# Patient Record
Sex: Male | Born: 1945 | Race: White | Hispanic: No | Marital: Married | Smoking: Former smoker
Health system: Southern US, Community
[De-identification: ages and names within clinical notes are randomized; demographics above are authoritative.]

## PROBLEM LIST (undated history)

## (undated) DIAGNOSIS — E785 Hyperlipidemia, unspecified: Secondary | ICD-10-CM

## (undated) DIAGNOSIS — I10 Essential (primary) hypertension: Secondary | ICD-10-CM

## (undated) DIAGNOSIS — F419 Anxiety disorder, unspecified: Secondary | ICD-10-CM

## (undated) DIAGNOSIS — E119 Type 2 diabetes mellitus without complications: Secondary | ICD-10-CM

## (undated) DIAGNOSIS — H9190 Unspecified hearing loss, unspecified ear: Secondary | ICD-10-CM

## (undated) DIAGNOSIS — I2510 Atherosclerotic heart disease of native coronary artery without angina pectoris: Secondary | ICD-10-CM

## (undated) DIAGNOSIS — Z9109 Other allergy status, other than to drugs and biological substances: Secondary | ICD-10-CM

## (undated) DIAGNOSIS — I890 Lymphedema, not elsewhere classified: Secondary | ICD-10-CM

## (undated) DIAGNOSIS — M1990 Unspecified osteoarthritis, unspecified site: Secondary | ICD-10-CM

## (undated) DIAGNOSIS — I499 Cardiac arrhythmia, unspecified: Secondary | ICD-10-CM

## (undated) HISTORY — PX: COLONOSCOPY: SHX174

## (undated) HISTORY — PX: FOOT SURGERY: SHX648

## (undated) HISTORY — PX: HERNIA REPAIR: SHX51

---

## 1977-05-07 HISTORY — PX: SCALP LACERATION REPAIR: SHX6089

## 2009-01-07 ENCOUNTER — Inpatient Hospital Stay: Payer: Self-pay | Admitting: Internal Medicine

## 2009-01-13 ENCOUNTER — Encounter: Payer: Self-pay | Admitting: Internal Medicine

## 2009-01-21 ENCOUNTER — Ambulatory Visit: Payer: Self-pay | Admitting: Internal Medicine

## 2009-01-28 ENCOUNTER — Inpatient Hospital Stay: Payer: Self-pay | Admitting: Internal Medicine

## 2010-02-22 IMAGING — CR DG CHEST 2V
1 series · 2 of 2 positions shown · non-contrast
Comparison: none

REASON FOR EXAM: fever
COMMENTS:

[Series 1: view not recorded · 0.17mm/px · 2 of 2 slices shown]
[im 1/2]
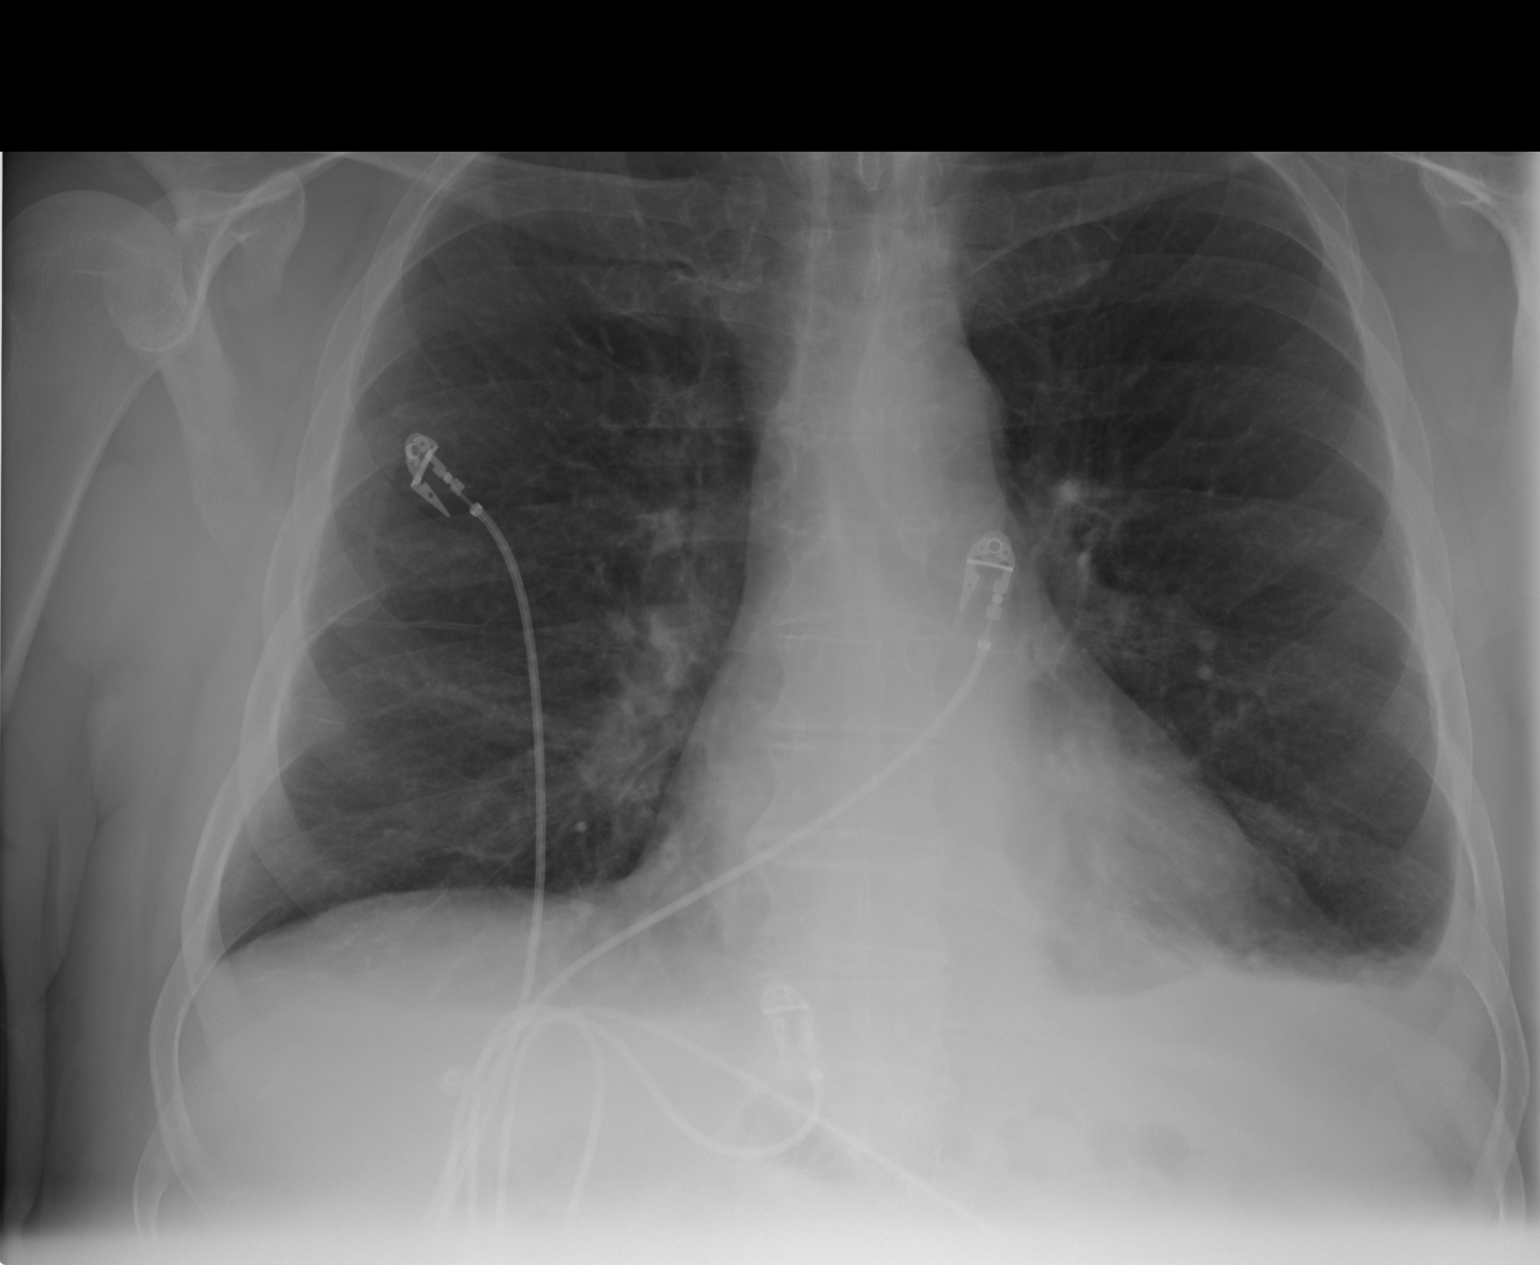
[im 2/2]
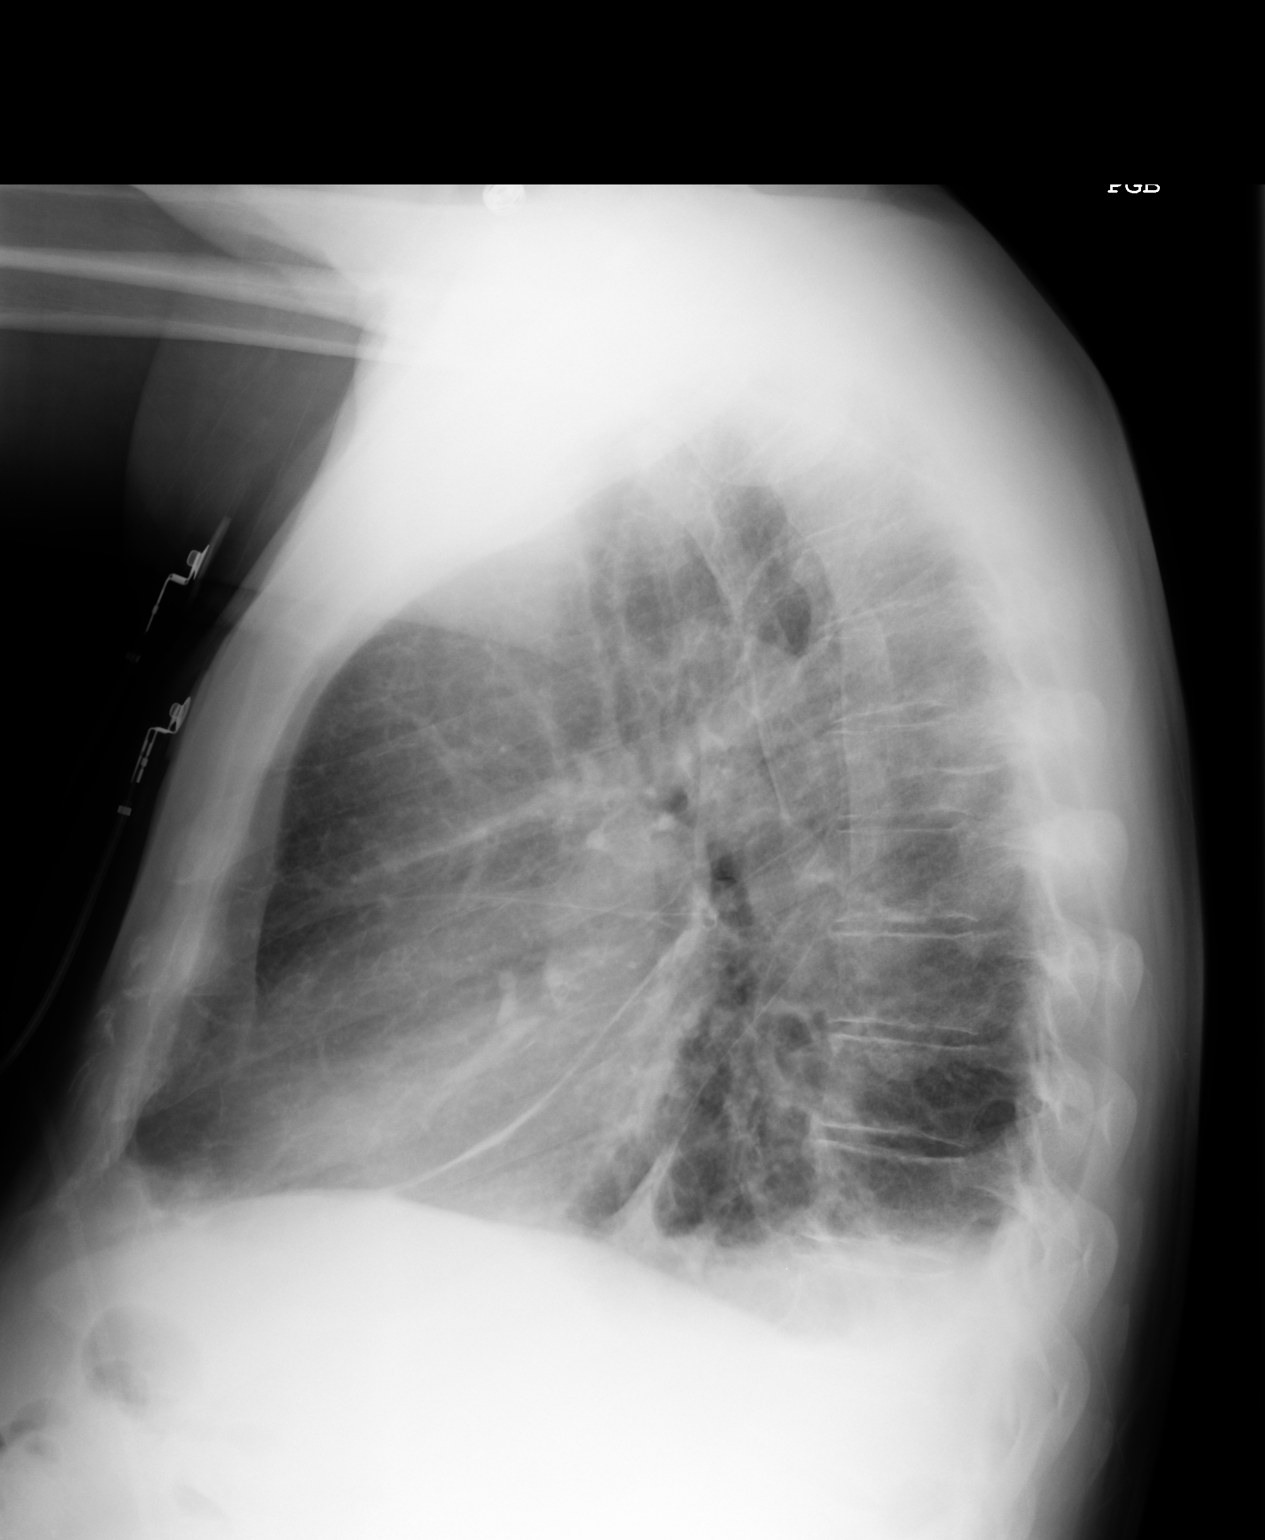

[2 of 2 positions shown; findings below may reference images not displayed]

PROCEDURE:     DXR - DXR CHEST PA (OR AP) AND LATERAL  - February 01, 2009  [DATE]

RESULT:     Comparison is made to the exam of 01/28/2009. There is a small
amount of pleural effusion and atelectasis at the left lung base. The
cardiac silhouette is normal. The right lung is clear. There is no edema or
pneumothorax. Cardiac monitoring electrodes are present.
IMPRESSION: Small left pleural effusion with some left basilar atelectasis.

## 2010-09-28 ENCOUNTER — Ambulatory Visit: Payer: Self-pay | Admitting: Urology

## 2010-10-10 ENCOUNTER — Ambulatory Visit: Payer: Self-pay | Admitting: Urology

## 2010-10-12 LAB — PATHOLOGY REPORT

## 2011-10-11 ENCOUNTER — Ambulatory Visit: Payer: Self-pay | Admitting: Gastroenterology

## 2011-10-15 LAB — PATHOLOGY REPORT

## 2012-02-04 DIAGNOSIS — N430 Encysted hydrocele: Secondary | ICD-10-CM | POA: Insufficient documentation

## 2012-02-04 DIAGNOSIS — N403 Nodular prostate with lower urinary tract symptoms: Secondary | ICD-10-CM | POA: Insufficient documentation

## 2012-02-04 DIAGNOSIS — R339 Retention of urine, unspecified: Secondary | ICD-10-CM | POA: Insufficient documentation

## 2012-02-04 DIAGNOSIS — N138 Other obstructive and reflux uropathy: Secondary | ICD-10-CM | POA: Insufficient documentation

## 2012-02-04 DIAGNOSIS — N411 Chronic prostatitis: Secondary | ICD-10-CM | POA: Insufficient documentation

## 2012-02-04 DIAGNOSIS — R972 Elevated prostate specific antigen [PSA]: Secondary | ICD-10-CM | POA: Insufficient documentation

## 2014-08-09 DIAGNOSIS — E118 Type 2 diabetes mellitus with unspecified complications: Secondary | ICD-10-CM | POA: Insufficient documentation

## 2015-07-26 ENCOUNTER — Encounter: Payer: Self-pay | Admitting: *Deleted

## 2015-08-04 ENCOUNTER — Encounter: Payer: Self-pay | Admitting: *Deleted

## 2015-08-04 ENCOUNTER — Ambulatory Visit
Admission: RE | Admit: 2015-08-04 | Discharge: 2015-08-04 | Disposition: A | Discharge status: Home / Self Care | Payer: Medicare Other | Source: Ambulatory Visit | Attending: Ophthalmology | Admitting: Ophthalmology

## 2015-08-04 ENCOUNTER — Ambulatory Visit: Payer: Medicare Other | Admitting: Certified Registered Nurse Anesthetist

## 2015-08-04 ENCOUNTER — Encounter
Admission: RE | Disposition: A | Discharge status: Home / Self Care | Payer: Self-pay | Source: Ambulatory Visit | Attending: Ophthalmology

## 2015-08-04 DIAGNOSIS — Z9889 Other specified postprocedural states: Secondary | ICD-10-CM | POA: Insufficient documentation

## 2015-08-04 DIAGNOSIS — H9190 Unspecified hearing loss, unspecified ear: Secondary | ICD-10-CM | POA: Diagnosis not present

## 2015-08-04 DIAGNOSIS — Z79899 Other long term (current) drug therapy: Secondary | ICD-10-CM | POA: Diagnosis not present

## 2015-08-04 DIAGNOSIS — M1990 Unspecified osteoarthritis, unspecified site: Secondary | ICD-10-CM | POA: Insufficient documentation

## 2015-08-04 DIAGNOSIS — H2511 Age-related nuclear cataract, right eye: Secondary | ICD-10-CM | POA: Insufficient documentation

## 2015-08-04 DIAGNOSIS — Z9109 Other allergy status, other than to drugs and biological substances: Secondary | ICD-10-CM | POA: Diagnosis not present

## 2015-08-04 DIAGNOSIS — I10 Essential (primary) hypertension: Secondary | ICD-10-CM | POA: Diagnosis not present

## 2015-08-04 DIAGNOSIS — H269 Unspecified cataract: Secondary | ICD-10-CM | POA: Diagnosis present

## 2015-08-04 HISTORY — PX: CATARACT EXTRACTION W/PHACO: SHX586

## 2015-08-04 HISTORY — DX: Unspecified osteoarthritis, unspecified site: M19.90

## 2015-08-04 HISTORY — DX: Unspecified hearing loss, unspecified ear: H91.90

## 2015-08-04 HISTORY — DX: Essential (primary) hypertension: I10

## 2015-08-04 HISTORY — DX: Other allergy status, other than to drugs and biological substances: Z91.09

## 2015-08-04 SURGERY — PHACOEMULSIFICATION, CATARACT, WITH IOL INSERTION
Anesthesia: Monitor Anesthesia Care | Site: Eye | Laterality: Right | Wound class: Clean

## 2015-08-04 MED ORDER — MOXIFLOXACIN HCL 0.5 % OP SOLN
OPHTHALMIC | Status: DC | PRN
  Administered 2015-08-04: 1 [drp] via OPHTHALMIC

## 2015-08-04 MED ORDER — ARMC OPHTHALMIC DILATING GEL
OPHTHALMIC | Status: AC
  Administered 2015-08-04: 1 via OPHTHALMIC
  Filled 2015-08-04: qty 0.25

## 2015-08-04 MED ORDER — SODIUM CHLORIDE 0.9 % IV SOLN
INTRAVENOUS | Status: DC
  Administered 2015-08-04 (×2): via INTRAVENOUS

## 2015-08-04 MED ORDER — ARMC OPHTHALMIC DILATING GEL
1.0000 "application " | OPHTHALMIC | Status: DC | PRN
  Administered 2015-08-04: 1 via OPHTHALMIC

## 2015-08-04 MED ORDER — MOXIFLOXACIN HCL 0.5 % OP SOLN: 1.0000 [drp] | Freq: Once | OPHTHALMIC | Status: DC

## 2015-08-04 MED ORDER — CEFUROXIME OPHTHALMIC INJECTION 1 MG/0.1 ML
INJECTION | OPHTHALMIC | Status: AC
  Filled 2015-08-04: qty 0.1

## 2015-08-04 MED ORDER — POVIDONE-IODINE 5 % OP SOLN
1.0000 "application " | Freq: Once | OPHTHALMIC | Status: AC
  Administered 2015-08-04: 1 via OPHTHALMIC

## 2015-08-04 MED ORDER — TETRACAINE HCL 0.5 % OP SOLN
1.0000 [drp] | Freq: Once | OPHTHALMIC | Status: AC
  Administered 2015-08-04: 1 [drp] via OPHTHALMIC

## 2015-08-04 MED ORDER — ONDANSETRON HCL 4 MG/2ML IJ SOLN: 4.0000 mg | Freq: Once | INTRAMUSCULAR | Status: DC | PRN

## 2015-08-04 MED ORDER — LIDOCAINE HCL (PF) 4 % IJ SOLN
INTRAMUSCULAR | Status: AC
  Filled 2015-08-04: qty 5

## 2015-08-04 MED ORDER — MOXIFLOXACIN HCL 0.5 % OP SOLN
OPHTHALMIC | Status: AC
  Filled 2015-08-04: qty 3

## 2015-08-04 MED ORDER — NA CHONDROIT SULF-NA HYALURON 40-17 MG/ML IO SOLN
INTRAOCULAR | Status: AC
  Filled 2015-08-04: qty 1

## 2015-08-04 MED ORDER — EPINEPHRINE HCL 1 MG/ML IJ SOLN
INTRAMUSCULAR | Status: AC
  Filled 2015-08-04: qty 2

## 2015-08-04 MED ORDER — FENTANYL CITRATE (PF) 100 MCG/2ML IJ SOLN
25.0000 ug | INTRAMUSCULAR | Status: DC | PRN
  Administered 2015-08-04 (×2): 50 ug via INTRAVENOUS

## 2015-08-04 MED ORDER — POVIDONE-IODINE 5 % OP SOLN
OPHTHALMIC | Status: AC
  Administered 2015-08-04: 1 via OPHTHALMIC
  Filled 2015-08-04: qty 30

## 2015-08-04 MED ORDER — LIDOCAINE HCL (PF) 4 % IJ SOLN
INTRAOCULAR | Status: DC | PRN
  Administered 2015-08-04: .5 mL via OPHTHALMIC

## 2015-08-04 MED ORDER — CEFUROXIME OPHTHALMIC INJECTION 1 MG/0.1 ML
INJECTION | OPHTHALMIC | Status: DC | PRN
  Administered 2015-08-04: .1 mL via INTRACAMERAL

## 2015-08-04 MED ORDER — NA CHONDROIT SULF-NA HYALURON 40-17 MG/ML IO SOLN
INTRAOCULAR | Status: DC | PRN
  Administered 2015-08-04: 1 mL via INTRAOCULAR

## 2015-08-04 MED ORDER — TETRACAINE HCL 0.5 % OP SOLN
OPHTHALMIC | Status: AC
  Administered 2015-08-04: 1 [drp] via OPHTHALMIC
  Filled 2015-08-04: qty 2

## 2015-08-04 MED ORDER — MIDAZOLAM HCL 2 MG/2ML IJ SOLN
INTRAMUSCULAR | Status: DC | PRN
  Administered 2015-08-04: 1 mg via INTRAVENOUS

## 2015-08-04 MED ORDER — CARBACHOL 0.01 % IO SOLN
INTRAOCULAR | Status: DC | PRN
  Administered 2015-08-04: .5 mL via INTRAOCULAR

## 2015-08-04 MED ORDER — EPINEPHRINE HCL 1 MG/ML IJ SOLN
INTRAOCULAR | Status: DC | PRN
  Administered 2015-08-04: 1 mL via OPHTHALMIC

## 2015-08-04 SURGICAL SUPPLY — 22 items
CANNULA ANT/CHMB 27GA (MISCELLANEOUS) ×2 IMPLANT
CUP MEDICINE 2OZ PLAST GRAD ST (MISCELLANEOUS) ×2 IMPLANT
GLOVE BIO SURGEON STRL SZ8 (GLOVE) ×2 IMPLANT
GLOVE BIOGEL M 6.5 STRL (GLOVE) ×2 IMPLANT
GLOVE SURG LX 8.0 MICRO (GLOVE) ×1
GLOVE SURG LX STRL 8.0 MICRO (GLOVE) ×1 IMPLANT
GOWN STRL REUS W/ TWL LRG LVL3 (GOWN DISPOSABLE) ×2 IMPLANT
GOWN STRL REUS W/TWL LRG LVL3 (GOWN DISPOSABLE) ×2
LENS IOL TECNIS 19.0 (Intraocular Lens) ×2 IMPLANT
LENS IOL TECNIS MONO 1P 19.0 (Intraocular Lens) ×1 IMPLANT
PACK CATARACT (MISCELLANEOUS) ×2 IMPLANT
PACK CATARACT BRASINGTON LX (MISCELLANEOUS) ×2 IMPLANT
PACK EYE AFTER SURG (MISCELLANEOUS) ×2 IMPLANT
SOL BSS BAG (MISCELLANEOUS) ×2
SOL PREP PVP 2OZ (MISCELLANEOUS) ×2
SOLUTION BSS BAG (MISCELLANEOUS) ×1 IMPLANT
SOLUTION PREP PVP 2OZ (MISCELLANEOUS) ×1 IMPLANT
SYR 3ML LL SCALE MARK (SYRINGE) ×2 IMPLANT
SYR 5ML LL (SYRINGE) ×2 IMPLANT
SYR TB 1ML 27GX1/2 LL (SYRINGE) ×2 IMPLANT
WATER STERILE IRR 1000ML POUR (IV SOLUTION) ×2 IMPLANT
WIPE NON LINTING 3.25X3.25 (MISCELLANEOUS) ×2 IMPLANT

## 2015-08-04 NOTE — H&P (Signed)
All labs reviewed. Abnormal studies sent to patients PCP when indicated.  Previous H&P reviewed, patient examined, there are NO CHANGES.  Jim Guerra LOUIS3/30/201711:39 AM

## 2015-08-04 NOTE — Discharge Instructions (Signed)
AMBULATORY SURGERY  DISCHARGE INSTRUCTIONS   1) The drugs that you were given will stay in your system until tomorrow so for the next 24 hours you should not:  A) Drive an automobile B) Make any legal decisions C) Drink any alcoholic beverage   2) You may resume regular meals tomorrow.  Today it is better to start with liquids and gradually work up to solid foods.  You may eat anything you prefer, but it is better to start with liquids, then soup and crackers, and gradually work up to solid foods.   3) Please notify your doctor immediately if you have any unusual bleeding, trouble breathing, redness and pain at the surgery site, drainage, fever, or pain not relieved by medication.    4) Additional Instructions:   Eye Surgery Discharge Instructions  Expect mild scratchy sensation or mild soreness. DO NOT RUB YOUR EYE!  The day of surgery:  Minimal physical activity, but bed rest is not required  No reading, computer work, or close hand work  No bending, lifting, or straining.  May watch TV  For 24 hours:  No driving, legal decisions, or alcoholic beverages  Safety precautions  Eat anything you prefer: It is better to start with liquids, then soup then solid foods.  _____ Eye patch should be worn until postoperative exam tomorrow.  ____ Solar shield eyeglasses should be worn for comfort in the sunlight/patch while sleeping  Resume all regular medications including aspirin or Coumadin if these were discontinued prior to surgery. You may shower, bathe, shave, or wash your hair. Tylenol may be taken for mild discomfort.  Call your doctor if you experience significant pain, nausea, or vomiting, fever > 101 or other signs of infection. [phone] or [phone] Specific instructions:  Follow-up Information    Follow up with PORFILIO,WILLIAM LOUIS, MD In 1 day.   Specialty:  Ophthalmology   Why:  March 31 at 10:45am   Contact information:   [address] [place] [address] [phone]          Please contact your physician with any problems or Same Day Surgery at [phone], Monday through Friday 6 am to 4 pm, or [facility] at [facility] Main number at [phone].

## 2015-08-04 NOTE — Transfer of Care (Signed)
Immediate Anesthesia Transfer of Care Note  Patient: Jim FiguresMorris E Zerkle Jr.  Procedure(s) Performed: Procedure(s) with comments: CATARACT EXTRACTION PHACO AND INTRAOCULAR LENS PLACEMENT (IOC) (Right) - US 00:50 AP% 21.4 CDE 10.79 fluid pack lot # [id number]  Patient Location: PACU  Anesthesia Type:MAC  Level of Consciousness: awake, alert  and oriented  Airway & Oxygen Therapy: Patient Spontanous Breathing and Patient connected to nasal cannula oxygen  Post-op Assessment: Report given to RN and Post -op Vital signs reviewed and stable  Post vital signs: Reviewed and stable  Last Vitals:  Filed Vitals:   08/04/15 0908  BP: 148/68  Pulse: 52  Resp: 16    Complications: No apparent anesthesia complications

## 2015-08-04 NOTE — Anesthesia Preprocedure Evaluation (Addendum)
Anesthesia Evaluation  Patient identified by MRN, date of birth, ID band Patient awake    Reviewed: Allergy & Precautions, NPO status , Patient's Chart, lab work & pertinent test results  Airway Mallampati: II  TM Distance: >3 FB     Dental  (+) Poor Dentition, Loose, Missing Missing many teeth:   Pulmonary neg pulmonary ROS,    Pulmonary exam normal        Cardiovascular hypertension, Pt. on medications Normal cardiovascular exam     Neuro/Psych negative neurological ROS  negative psych ROS   GI/Hepatic negative GI ROS, Neg liver ROS,   Endo/Other    Renal/GU negative Renal ROS  negative genitourinary   Musculoskeletal  (+) Arthritis , Osteoarthritis,    Abdominal Normal abdominal exam  (+)   Peds negative pediatric ROS (+)  Hematology negative hematology ROS (+)   Anesthesia Other Findings   Reproductive/Obstetrics                            Anesthesia Physical Anesthesia Plan  ASA: III  Anesthesia Plan: MAC   Post-op Pain Management:    Induction: Intravenous  Airway Management Planned: Nasal Cannula  Additional Equipment:   Intra-op Plan:   Post-operative Plan:   Informed Consent: I have reviewed the patients History and Physical, chart, labs and discussed the procedure including the risks, benefits and alternatives for the proposed anesthesia with the patient or authorized representative who has indicated his/her understanding and acceptance.   Dental advisory given  Plan Discussed with: CRNA and Surgeon  Anesthesia Plan Comments:         Anesthesia Quick Evaluation

## 2015-08-04 NOTE — Op Note (Signed)
PREOPERATIVE DIAGNOSIS:  Nuclear sclerotic cataract of the right eye.   POSTOPERATIVE DIAGNOSIS: nuclear sclerotic cataract right eye   OPERATIVE PROCEDURE:  Procedure(s): CATARACT EXTRACTION PHACO AND INTRAOCULAR LENS PLACEMENT (IOC)   SURGEON:  Galen ManilaWilliam Ames Hoban, MD.   ANESTHESIA:  Anesthesiologist: Yves DillPaul Carroll, MD CRNA: Junious SilkMark Noles, CRNA  1.      Managed anesthesia care. 2.      Topical tetracaine drops followed by 2% Xylocaine jelly applied in the preoperative holding area.       3.  0.2 ml of epi-Shugarcaine was  placed in the anterior chamber following the paracentesis.    COMPLICATIONS:  None.   TECHNIQUE:   Stop and chop   DESCRIPTION OF PROCEDURE:  The patient was examined and consented in the preoperative holding area where the aforementioned topical anesthesia was applied to the right eye and then brought back to the Operating Room where the right eye was prepped and draped in the usual sterile ophthalmic fashion and a lid speculum was placed. A paracentesis was created with the side port blade and the anterior chamber was filled with viscoelastic. A near clear corneal incision was performed with the steel keratome. A continuous curvilinear capsulorrhexis was performed with a cystotome followed by the capsulorrhexis forceps. Hydrodissection and hydrodelineation were carried out with BSS on a blunt cannula. The lens was removed in a stop and chop  technique and the remaining cortical material was removed with the irrigation-aspiration handpiece. The capsular bag was inflated with viscoelastic and the Technis [id number]  lens was placed in the capsular bag without complication. The remaining viscoelastic was removed from the eye with the irrigation-aspiration handpiece. The wounds were hydrated. The anterior chamber was flushed with Miostat and the eye was inflated to physiologic pressure. 0.1 mL of cefuroxime concentration 10 mg/mL was placed in the anterior chamber. The wounds were found  to be water tight. The eye was dressed with Vigamox. The patient was given protective glasses to wear throughout the day and a shield with which to sleep tonight. The patient was also given drops with which to begin a drop regimen today and will follow-up with me in one day.  Implant Name Type Inv. Item Serial No. Manufacturer Lot No. LRB No. Used  LENS IOL TECNIS 19.0 - [id number] Intraocular Lens LENS IOL TECNIS 19.0 [id number] AMO [id number] Right 1   Procedure(s) with comments: CATARACT EXTRACTION PHACO AND INTRAOCULAR LENS PLACEMENT (IOC) (Right) - US 00:50 AP% 21.4 CDE 10.79 fluid pack lot # [id number] H  Electronically signed: Viha Kriegel LOUIS 08/04/2015 12:08 PM

## 2015-08-04 NOTE — Anesthesia Postprocedure Evaluation (Signed)
Anesthesia Post Note  Patient: Jim FiguresMorris E Scullion Jr.  Procedure(s) Performed: Procedure(s) (LRB): CATARACT EXTRACTION PHACO AND INTRAOCULAR LENS PLACEMENT (IOC) (Right)  Patient location during evaluation: PACU Anesthesia Type: MAC Level of consciousness: awake, awake and alert and oriented Pain management: pain level controlled Vital Signs Assessment: post-procedure vital signs reviewed and stable Respiratory status: spontaneous breathing, nonlabored ventilation and respiratory function stable Cardiovascular status: stable Anesthetic complications: no    Last Vitals:  Filed Vitals:   08/04/15 0908  BP: 148/68  Pulse: 52  Resp: 16    Last Pain: There were no vitals filed for this visit.               Vinicio Lynk,  [employer]

## 2015-12-15 ENCOUNTER — Ambulatory Visit
Admission: RE | Admit: 2015-12-15 | Discharge: 2015-12-15 | Disposition: A | Discharge status: Home / Self Care | Payer: Medicare Other | Source: Ambulatory Visit | Attending: Physician Assistant | Admitting: Physician Assistant

## 2015-12-15 ENCOUNTER — Other Ambulatory Visit: Payer: Self-pay | Admitting: Physician Assistant

## 2015-12-15 DIAGNOSIS — I82431 Acute embolism and thrombosis of right popliteal vein: Secondary | ICD-10-CM | POA: Insufficient documentation

## 2015-12-15 DIAGNOSIS — R600 Localized edema: Secondary | ICD-10-CM

## 2016-02-23 DIAGNOSIS — I890 Lymphedema, not elsewhere classified: Secondary | ICD-10-CM | POA: Insufficient documentation

## 2016-05-15 ENCOUNTER — Encounter (INDEPENDENT_AMBULATORY_CARE_PROVIDER_SITE_OTHER): Payer: Self-pay | Admitting: Vascular Surgery

## 2016-05-15 ENCOUNTER — Ambulatory Visit (INDEPENDENT_AMBULATORY_CARE_PROVIDER_SITE_OTHER): Payer: Medicare Other | Admitting: Vascular Surgery

## 2016-05-15 VITALS — BP 131/67 | HR 72 | Resp 16 | Ht 70.0 in | Wt 214.0 lb

## 2016-05-15 DIAGNOSIS — I10 Essential (primary) hypertension: Secondary | ICD-10-CM | POA: Diagnosis not present

## 2016-05-15 DIAGNOSIS — L03119 Cellulitis of unspecified part of limb: Secondary | ICD-10-CM | POA: Diagnosis not present

## 2016-05-15 DIAGNOSIS — I890 Lymphedema, not elsewhere classified: Secondary | ICD-10-CM

## 2016-05-15 NOTE — Progress Notes (Signed)
Subjective:    Patient ID: Jim Figures., male    DOB: 1945-10-29, 71 y.o.   MRN: [id number] Chief Complaint  Patient presents with  . Re-evaluation    4 month follow up   Patient last seen on 01/13/16 with a chief complaint of cellulitis and chronic DVT. At the time, the patient was given a RX for compression stockings which he states he worse until it "got cold". Patient states she was "doing good" with the stockings and stopped when the weather turned cold as he was too "chilly" to wear the compression stockings. Patient does not elevate his legs. He states his legs have recently started to "leak" and they are peeling. He denies any fever, nausea or vomiting.    Review of Systems  Constitutional: Negative.   HENT: Negative.   Eyes: Negative.   Respiratory: Negative.   Cardiovascular: Positive for leg swelling (bilateral lower extremity with weeping).  Gastrointestinal: Negative.   Endocrine: Negative.   Genitourinary: Negative.   Musculoskeletal: Negative.   Skin:       Bilateral red legs  Allergic/Immunologic: Negative.   Neurological: Negative.   Hematological: Negative.   Psychiatric/Behavioral: Negative.       Objective:   Physical Exam  Constitutional: He is oriented to person, place, and time. He appears well-developed and well-nourished.  HENT:  Head: Normocephalic and atraumatic.  Right Ear: External ear normal.  Left Ear: External ear normal.  Poor dentition  Eyes: Conjunctivae and EOM are normal. Pupils are equal, round, and reactive to light.  Neck: Normal range of motion.  Cardiovascular: Normal rate, regular rhythm, normal heart sounds and intact distal pulses.   Pulmonary/Chest: Effort normal and breath sounds normal.  Abdominal: Soft. Bowel sounds are normal.  Musculoskeletal: Normal range of motion. He exhibits edema (Moderate bilateral edema).  Neurological: He is alert and oriented to person, place, and time.  Skin:  Bilateral cellulitis with  ulcerations weeping clear fluid. Very shallow ulcerations noted. No purulent discharge.   Psychiatric: He has a normal mood and affect. His behavior is normal. Judgment and thought content normal.   BP 131/67 (BP Location: Left Arm)   Pulse 72   Resp 16   Ht 5\' 10"  (1.778 m)   Wt 214 lb (97.1 kg)   BMI 30.71 kg/m   Past Medical History:  Diagnosis Date  . Arthritis   . Environmental allergies   . HOH (hard of hearing)   . Hypertension    Social History   Social History  . Marital status: Married    Spouse name: N/A  . Number of children: N/A  . Years of education: N/A   Occupational History  . Not on file.   Social History Main Topics  . Smoking status: Never Smoker  . Smokeless tobacco: Not on file  . Alcohol use No  . Drug use: Unknown  . Sexual activity: Not on file   Other Topics Concern  . Not on file   Social History Narrative  . No narrative on file   Past Surgical History:  Procedure Laterality Date  . CATARACT EXTRACTION W/PHACO Right 08/04/2015   Procedure: CATARACT EXTRACTION PHACO AND INTRAOCULAR LENS PLACEMENT (IOC);  Surgeon: Galen Manila, MD;  Location: [facility] ORS;  Service: Ophthalmology;  Laterality: Right;  [place] 00:50 AP% 21.4 CDE 10.79 fluid pack lot # [id number]  . COLONOSCOPY    . FOOT SURGERY    . HERNIA REPAIR     No family history on file.  No Known Allergies     Assessment & Plan:  Patient last seen on 01/13/16 with a chief complaint of cellulitis and chronic DVT. At the time, the patient was given a RX for compression stockings which he states he worse until it "got cold". Patient states she was "doing good" with the stockings and stopped when the weather turned cold as he was too "chilly" to wear the compression stockings. Patient does not elevate his legs. He states his legs have recently started to "leak" and they are peeling. He denies any fever, nausea or vomiting.   1. Lymphedema - Stable Patient was wearing compression  stockings however stopped when weather became colder. Not elevating his legs. Now with lymphedema exacerbation and cellulitis. Will wrap with unna wraps for four weeks. Patient encouraged to elevate legs. We discussed appropriate elevation. Keflex 500 mg PO q6h #40 given. Patient may benefit from lymphedema pump will revaluate in one month.   2. Cellulitis of lower extremity, unspecified laterality - Recurrent Patient was wearing compression stockings however stopped when weather became colder. Not elevating his legs. Now with lymphedema exacerbation and cellulitis. Will wrap with unna wraps for four weeks. Patient encouraged to elevate legs. We discussed appropriate elevation. Keflex 500 mg PO q6h #40 given. Patient may benefit from lymphedema pump will revaluate in one month.   3. Essential hypertension - Stable Encouraged good control as its slows the progression of atherosclerotic disease  Current Outpatient Prescriptions on File Prior to Visit  Medication Sig Dispense Refill  . finasteride (PROSCAR) 5 MG tablet Take 5 mg by mouth daily.    Marland Kitchen. losartan-hydrochlorothiazide (HYZAAR) 100-25 MG tablet Take 1 tablet by mouth daily.     No current facility-administered medications on file prior to visit.     There are no Patient Instructions on file for this visit. No Follow-up on file.   KIMBERLY A STEGMAYER, PA-C

## 2016-05-22 ENCOUNTER — Encounter (INDEPENDENT_AMBULATORY_CARE_PROVIDER_SITE_OTHER): Payer: Self-pay | Admitting: Vascular Surgery

## 2016-05-22 ENCOUNTER — Ambulatory Visit (INDEPENDENT_AMBULATORY_CARE_PROVIDER_SITE_OTHER): Payer: Medicare Other | Admitting: Vascular Surgery

## 2016-05-22 VITALS — BP 120/67 | HR 65 | Resp 16 | Ht 66.0 in | Wt 215.0 lb

## 2016-05-22 DIAGNOSIS — M7989 Other specified soft tissue disorders: Secondary | ICD-10-CM | POA: Insufficient documentation

## 2016-05-22 NOTE — Progress Notes (Signed)
Assessments & Plan   Bilateral Lower Extremity Swelling                          Additional instructions             Subjective:     Patient presents with venous ulcer of the Bilateral lower extremity.                          Procedure:     3 layer unna wrap was placed bilateral lower extremity.              Plan:               Follow up in one week.

## 2016-05-29 ENCOUNTER — Ambulatory Visit (INDEPENDENT_AMBULATORY_CARE_PROVIDER_SITE_OTHER): Payer: Medicare Other | Admitting: Vascular Surgery

## 2016-05-29 ENCOUNTER — Encounter (INDEPENDENT_AMBULATORY_CARE_PROVIDER_SITE_OTHER): Payer: Self-pay | Admitting: Vascular Surgery

## 2016-05-29 VITALS — BP 101/57 | HR 62 | Resp 16 | Ht 72.0 in | Wt 211.0 lb

## 2016-05-29 DIAGNOSIS — M7989 Other specified soft tissue disorders: Secondary | ICD-10-CM | POA: Diagnosis not present

## 2016-05-29 NOTE — Progress Notes (Signed)
History of Present Illness  There is no documented history at this time  Assessments & Plan   There are no diagnoses linked to this encounter.    Additional instructions  Subjective:  Patient presents with venous ulcer of the Bilateral lower extremity.    Procedure:  3 layer unna wrap was placed Bilateral lower extremity.   Plan:   Follow up in one week.  

## 2016-06-05 ENCOUNTER — Encounter (INDEPENDENT_AMBULATORY_CARE_PROVIDER_SITE_OTHER): Payer: Self-pay | Admitting: Vascular Surgery

## 2016-06-05 ENCOUNTER — Ambulatory Visit (INDEPENDENT_AMBULATORY_CARE_PROVIDER_SITE_OTHER): Payer: Medicare Other | Admitting: Vascular Surgery

## 2016-06-05 VITALS — BP 124/73 | HR 60 | Resp 16 | Ht 66.0 in | Wt 210.0 lb

## 2016-06-05 DIAGNOSIS — M7989 Other specified soft tissue disorders: Secondary | ICD-10-CM

## 2016-06-05 NOTE — Progress Notes (Signed)
History of Present Illness  There is no documented history at this time  Assessments & Plan   There are no diagnoses linked to this encounter.    Additional instructions  Subjective:  Patient presents with venous ulcer of the Bilateral lower extremity.    Procedure:  3 layer unna wrap was placed Bilateral lower extremity.   Plan:   Follow up in one week.  

## 2016-06-12 ENCOUNTER — Ambulatory Visit (INDEPENDENT_AMBULATORY_CARE_PROVIDER_SITE_OTHER): Payer: Medicare Other | Admitting: Vascular Surgery

## 2016-06-12 VITALS — BP 133/65 | HR 64 | Resp 18 | Ht 68.0 in | Wt 203.0 lb

## 2016-06-12 DIAGNOSIS — L03119 Cellulitis of unspecified part of limb: Secondary | ICD-10-CM | POA: Insufficient documentation

## 2016-06-12 DIAGNOSIS — I890 Lymphedema, not elsewhere classified: Secondary | ICD-10-CM

## 2016-06-12 DIAGNOSIS — M7989 Other specified soft tissue disorders: Secondary | ICD-10-CM | POA: Diagnosis not present

## 2016-06-12 NOTE — Progress Notes (Signed)
MRN : [id number]  Jim FiguresMorris E Oplinger Jr. is a 71 y.o. (1945/09/04) male who presents with chief complaint of  Chief Complaint  Patient presents with  . Re-evaluation    Unna boot check  .  History of Present Illness: Patient returns today in follow up of His leg swelling and weeping. He took his Unna boots off yesterday and his legs of been very tender to the touch. The skin is improved but not healed from his last visit. There is more weeping on the left leg than the right leg. The swelling is improved.  Current Outpatient Prescriptions on File Prior to Visit  Medication Sig Dispense Refill  . finasteride (PROSCAR) 5 MG tablet Take 5 mg by mouth daily.    Marland Kitchen. losartan-hydrochlorothiazide (HYZAAR) 100-25 MG tablet Take 1 tablet by mouth daily.     No current facility-administered medications on file prior to visit.      Review of Systems  Constitutional: Negative.   HENT: Negative.   Eyes: Negative.   Respiratory: Negative.   Cardiovascular: Positive for leg swelling (bilateral lower extremity with weeping).  Gastrointestinal: Negative.   Endocrine: Negative.   Genitourinary: Negative.   Musculoskeletal: Negative.   Skin:       Bilateral red legs  Allergic/Immunologic: Negative.   Neurological: Negative.   Hematological: Negative.   Psychiatric/Behavioral: Negative.       Objective:   Physical Exam  Constitutional: He is oriented to person, place, and time. He appears well-developed and well-nourished.  HENT:  Head: Normocephalic and atraumatic.  Right Ear: External ear normal.  Left Ear: External ear normal.  Poor dentition  Eyes: Conjunctivae and EOM are normal. Pupils are equal, round, and reactive to light.  Neck: Normal range of motion.  Cardiovascular: Normal rate, regular rhythm, normal heart sounds and intact distal pulses.   Pulmonary/Chest: Effort normal and breath sounds normal.  Abdominal: Soft. Bowel sounds are normal.  Musculoskeletal: Normal  range of motion. He exhibits edema (Mild bilateral edema).  Neurological: He is alert and oriented to person, place, and time.  Skin:  Bilateral cellulitis with ulcerations weeping clear fluid on the left. Minimal on the right. Very shallow ulcerations noted. No purulent discharge.   Psychiatric: He has a normal mood and affect. His behavior is normal. Judgment and thought content normal.   BP 131/67 (BP Location: Left Arm)   Pulse 72   Resp 16   Ht 5\' 10"  (1.778 m)   Wt 214 lb (97.1 kg)   BMI 30.71 kg/m       Past Medical History:  Diagnosis Date  . Arthritis   . Environmental allergies   . HOH (hard of hearing)   . Hypertension    Social History        Social History  . Marital status: Married    Spouse name: N/A  . Number of children: N/A  . Years of education: N/A      Occupational History  . Not on file.       Social History Main Topics  . Smoking status: Never Smoker  . Smokeless tobacco: Not on file  . Alcohol use No  . Drug use: Unknown  . Sexual activity: Not on file       Other Topics Concern  . Not on file      Social History Narrative  . No narrative on file        Past Surgical History:  Procedure Laterality Date  . CATARACT EXTRACTION  W/PHACO Right 08/04/2015   Procedure: CATARACT EXTRACTION PHACO AND INTRAOCULAR LENS PLACEMENT (IOC);  Surgeon: Galen Manila, MD;  Location: [facility] ORS;  Service: Ophthalmology;  Laterality: Right;  [place] 00:50 AP% 21.4 CDE 10.79 fluid pack lot # [id number]  . COLONOSCOPY    . FOOT SURGERY    . HERNIA REPAIR     No family history on file.  No Known Allergies    Labs No results found for this or any previous visit (from the past 2160 hour(s)).  Radiology No results found.    Assessment/Plan  Swelling of lower extremity Better after several weeks of Unna boots, but still with some skin breakdown and inflammation. Three layer UNNA Boots placed bilaterally today.     Cellulitis of lower extremity Significantly improved after antibiotic therapy and several weeks of Unna boots. Skin is not entirely healed.  Lymphedema Once his skin is healed and we get him into compression stockings, I think he would benefit from a lymphedema pump.    Festus Barren, MD  06/12/2016 10:27 AM    This note was created with Dragon medical transcription system.  Any errors from dictation are purely unintentional

## 2016-06-12 NOTE — Assessment & Plan Note (Signed)
Once his skin is healed and we get him into compression stockings, I think he would benefit from a lymphedema pump.

## 2016-06-12 NOTE — Assessment & Plan Note (Signed)
Significantly improved after antibiotic therapy and several weeks of Unna boots. Skin is not entirely healed.

## 2016-06-12 NOTE — Assessment & Plan Note (Addendum)
Better after several weeks of Unna boots, but still with some skin breakdown and inflammation. Three layer UNNA Boots placed bilaterally today.

## 2016-06-18 ENCOUNTER — Ambulatory Visit (INDEPENDENT_AMBULATORY_CARE_PROVIDER_SITE_OTHER): Payer: Medicare Other | Admitting: Vascular Surgery

## 2016-06-18 ENCOUNTER — Encounter (INDEPENDENT_AMBULATORY_CARE_PROVIDER_SITE_OTHER): Payer: Self-pay

## 2016-06-18 VITALS — BP 113/71 | HR 55 | Resp 16 | Wt 206.0 lb

## 2016-06-18 DIAGNOSIS — L97321 Non-pressure chronic ulcer of left ankle limited to breakdown of skin: Secondary | ICD-10-CM

## 2016-06-18 DIAGNOSIS — M7989 Other specified soft tissue disorders: Secondary | ICD-10-CM | POA: Diagnosis not present

## 2016-06-18 DIAGNOSIS — I83013 Varicose veins of right lower extremity with ulcer of ankle: Secondary | ICD-10-CM | POA: Diagnosis not present

## 2016-06-18 DIAGNOSIS — I83023 Varicose veins of left lower extremity with ulcer of ankle: Secondary | ICD-10-CM

## 2016-06-18 DIAGNOSIS — L97302 Non-pressure chronic ulcer of unspecified ankle with fat layer exposed: Secondary | ICD-10-CM

## 2016-06-18 DIAGNOSIS — L97311 Non-pressure chronic ulcer of right ankle limited to breakdown of skin: Secondary | ICD-10-CM | POA: Diagnosis not present

## 2016-06-18 DIAGNOSIS — I83003 Varicose veins of unspecified lower extremity with ulcer of ankle: Secondary | ICD-10-CM | POA: Insufficient documentation

## 2016-06-18 DIAGNOSIS — L97309 Non-pressure chronic ulcer of unspecified ankle with unspecified severity: Secondary | ICD-10-CM

## 2016-06-18 NOTE — Progress Notes (Signed)
History of Present Illness  There is no documented history at this time  Assessments & Plan   There are no diagnoses linked to this encounter.    Additional instructions  Subjective:  Patient presents with venous ulcer of the Bilateral lower extremity.    Procedure:  3 layer unna wrap was placed Bilateral lower extremity.   Plan:   Follow up in one week.  

## 2016-06-25 ENCOUNTER — Encounter (INDEPENDENT_AMBULATORY_CARE_PROVIDER_SITE_OTHER): Payer: Self-pay

## 2016-06-25 ENCOUNTER — Ambulatory Visit (INDEPENDENT_AMBULATORY_CARE_PROVIDER_SITE_OTHER): Payer: Medicare Other | Admitting: Vascular Surgery

## 2016-06-25 VITALS — BP 126/63 | HR 58 | Resp 16 | Wt 204.0 lb

## 2016-06-25 DIAGNOSIS — L97302 Non-pressure chronic ulcer of unspecified ankle with fat layer exposed: Secondary | ICD-10-CM

## 2016-06-25 DIAGNOSIS — I83003 Varicose veins of unspecified lower extremity with ulcer of ankle: Secondary | ICD-10-CM | POA: Diagnosis not present

## 2016-06-25 DIAGNOSIS — M7989 Other specified soft tissue disorders: Secondary | ICD-10-CM

## 2016-06-25 NOTE — Progress Notes (Signed)
History of Present Illness  There is no documented history at this time  Assessments & Plan   There are no diagnoses linked to this encounter.    Additional instructions  Subjective:  Patient presents with venous ulcer of the Bilateral lower extremity.    Procedure:  3 layer unna wrap was placed Bilateral lower extremity.   Plan:   Follow up in one week.  

## 2016-07-02 ENCOUNTER — Ambulatory Visit (INDEPENDENT_AMBULATORY_CARE_PROVIDER_SITE_OTHER): Payer: Medicare Other | Admitting: Vascular Surgery

## 2016-07-02 ENCOUNTER — Encounter (INDEPENDENT_AMBULATORY_CARE_PROVIDER_SITE_OTHER): Payer: Self-pay | Admitting: Vascular Surgery

## 2016-07-02 VITALS — BP 127/67 | HR 53 | Resp 16 | Ht 69.0 in | Wt 205.0 lb

## 2016-07-02 DIAGNOSIS — L97302 Non-pressure chronic ulcer of unspecified ankle with fat layer exposed: Secondary | ICD-10-CM

## 2016-07-02 DIAGNOSIS — I83003 Varicose veins of unspecified lower extremity with ulcer of ankle: Secondary | ICD-10-CM

## 2016-07-02 NOTE — Progress Notes (Signed)
History of Present Illness  There is no documented history at this time  Assessments & Plan   There are no diagnoses linked to this encounter.    Additional instructions  Subjective:  Patient presents with venous ulcer of the Bilateral lower extremity.    Procedure:  3 layer unna wrap was placed Bilateral lower extremity.   Plan:   Follow up in one week.  

## 2016-07-09 ENCOUNTER — Encounter (INDEPENDENT_AMBULATORY_CARE_PROVIDER_SITE_OTHER): Payer: Self-pay | Admitting: Vascular Surgery

## 2016-07-09 ENCOUNTER — Ambulatory Visit (INDEPENDENT_AMBULATORY_CARE_PROVIDER_SITE_OTHER): Payer: Medicare Other | Admitting: Vascular Surgery

## 2016-07-09 VITALS — BP 139/78 | HR 61 | Resp 16 | Wt 207.8 lb

## 2016-07-09 DIAGNOSIS — L97302 Non-pressure chronic ulcer of unspecified ankle with fat layer exposed: Secondary | ICD-10-CM

## 2016-07-09 DIAGNOSIS — L03119 Cellulitis of unspecified part of limb: Secondary | ICD-10-CM

## 2016-07-09 DIAGNOSIS — I890 Lymphedema, not elsewhere classified: Secondary | ICD-10-CM | POA: Diagnosis not present

## 2016-07-09 DIAGNOSIS — I83003 Varicose veins of unspecified lower extremity with ulcer of ankle: Secondary | ICD-10-CM

## 2016-07-09 NOTE — Progress Notes (Signed)
Subjective:    Patient ID: Jim Figures., male    DOB: Oct 15, 1945, 71 y.o.   MRN: [id number] Chief Complaint  Patient presents with  . Follow-up    unna check   The patient presents in follow up for a two month lymphedema with ulceration follow up. The patient states improvement in his lower extremity swelling and ulceration. Denies any fever, nausea or vomiting. The patient has two pairs of compression stockings 20-71mmhg with him today.    Review of Systems Constitutional: Negative.  HENT: Negative.  Eyes: Negative.  Respiratory: Negative.  Cardiovascular: Positive for leg swelling Gastrointestinal: Negative.  Endocrine: Negative.  Genitourinary: Negative.  Musculoskeletal: Negative.  Skin: Negative  Allergic/Immunologic: Negative.  Neurological: Negative.  Hematological: Negative.  Psychiatric/Behavioral: Negative.    Objective:   Physical Exam Constitutional: He is oriented to person, place, and time. He appears well-developedand well-nourished.  HENT:  Head: Normocephalicand atraumatic.  Right Ear: External earnormal.  Left Ear: External earnormal.  Poor dentition Eyes: Conjunctivaeand EOMare normal. Pupils are equal, round, and reactive to light.  Neck: Normal range of motion.  Cardiovascular: Normal rate, regular rhythm, normal heart soundsand intact distal pulses.  Pulmonary/Chest: Effort normaland breath sounds normal.  Abdominal: Soft. Bowel sounds are normal.  Musculoskeletal: Normal range of motion. He exhibits edema(Mild bilateral edema).  Neurological: He is alertand oriented to person, place, and time.  Skin: No cellulitis noted. Ulcerations have healed.  Psychiatric: He has a normal mood and affect. His behavior is normal. Judgmentand thought contentnormal.   BP 139/78   Pulse 61   Resp 16   Wt 207 lb 12.8 oz (94.3 kg)   BMI 30.69 kg/m   Past Medical History:  Diagnosis Date  . Arthritis   . Environmental  allergies   . HOH (hard of hearing)   . Hypertension    Social History   Social History  . Marital status: Married    Spouse name: N/A  . Number of children: N/A  . Years of education: N/A   Occupational History  . Not on file.   Social History Main Topics  . Smoking status: Never Smoker  . Smokeless tobacco: Never Used  . Alcohol use No  . Drug use: Unknown  . Sexual activity: Not on file   Other Topics Concern  . Not on file   Social History Narrative  . No narrative on file   Past Surgical History:  Procedure Laterality Date  . CATARACT EXTRACTION W/PHACO Right 08/04/2015   Procedure: CATARACT EXTRACTION PHACO AND INTRAOCULAR LENS PLACEMENT (IOC);  Surgeon: Galen Manila, MD;  Location: [facility] ORS;  Service: Ophthalmology;  Laterality: Right;  [place] 00:50 AP% 21.4 CDE 10.79 fluid pack lot # [id number]  . COLONOSCOPY    . FOOT SURGERY    . HERNIA REPAIR     No family history on file.  No Known Allergies     Assessment & Plan:  The patient presents in follow up for a two month lymphedema with ulceration follow up. The patient states improvement in his lower extremity swelling and ulceration. Denies any fever, nausea or vomiting.   1. Lymphedema - Improved Will transition patient to compression stockings and stop unna wraps. We had a long conversation about how to wear compression stockings and elevation. The patient can be forgetful at times. Patient states "not ready" for lymphedema pump. Will bring back in one month to assess progress.   2. Cellulitis of lower extremity, unspecified laterality - Resolved Discussed importance  of compression and elevation to control edema and recurrent cellulitis.   3. 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   Current Outpatient  Prescriptions on File Prior to Visit  Medication Sig Dispense Refill  . aspirin EC 325 MG tablet Take by mouth.    . finasteride (PROSCAR) 5 MG tablet Take 5 mg by mouth daily.    Marland Kitchen. losartan-hydrochlorothiazide (HYZAAR) 100-25 MG tablet Take 1 tablet by mouth daily.    . polyethylene glycol (MIRALAX / GLYCOLAX) packet Take by mouth.    . triamcinolone cream (KENALOG) 0.1 % Apply topically.     No current facility-administered medications on file prior to visit.     There are no Patient Instructions on file for this visit. Return in about 1 month (around 08/09/2016) for Lymphedema Check.   KIMBERLY A STEGMAYER, PA-C

## 2016-08-14 ENCOUNTER — Encounter (INDEPENDENT_AMBULATORY_CARE_PROVIDER_SITE_OTHER): Payer: Self-pay | Admitting: Vascular Surgery

## 2016-08-14 ENCOUNTER — Ambulatory Visit (INDEPENDENT_AMBULATORY_CARE_PROVIDER_SITE_OTHER): Payer: Medicare Other | Admitting: Vascular Surgery

## 2016-08-14 VITALS — BP 124/73 | HR 68 | Resp 18 | Wt 207.0 lb

## 2016-08-14 DIAGNOSIS — M7989 Other specified soft tissue disorders: Secondary | ICD-10-CM | POA: Diagnosis not present

## 2016-08-14 DIAGNOSIS — I890 Lymphedema, not elsewhere classified: Secondary | ICD-10-CM

## 2016-08-14 NOTE — Progress Notes (Signed)
MRN : [id number]  Jim Guerra. is a 71 y.o. (March 03, 1946) male who presents with chief complaint of  Chief Complaint  Patient presents with  . Follow-up  .  History of Present Illness: Patient returns today in follow up of Leg swelling and lymphedema. He has been diligently wearing his compression stockings which has helped keep the skin intact. He does still have some swelling although it has gotten that her daily use of compression stockings. He is using moisturizers for his dry skin. He does not have fever or chills.  Current Outpatient Prescriptions  Medication Sig Dispense Refill  . aspirin EC 325 MG tablet Take by mouth.    . finasteride (PROSCAR) 5 MG tablet Take 5 mg by mouth daily.    Marland Kitchen losartan-hydrochlorothiazide (HYZAAR) 100-25 MG tablet Take 1 tablet by mouth daily.    . polyethylene glycol (MIRALAX / GLYCOLAX) packet Take by mouth.    . triamcinolone cream (KENALOG) 0.1 % Apply topically.     No current facility-administered medications for this visit.     Past Medical History:  Diagnosis Date  . Arthritis   . Environmental allergies   . HOH (hard of hearing)   . Hypertension     Past Surgical History:  Procedure Laterality Date  . CATARACT EXTRACTION W/PHACO Right 08/04/2015   Procedure: CATARACT EXTRACTION PHACO AND INTRAOCULAR LENS PLACEMENT (IOC);  Surgeon: Galen Manila, MD;  Location: [facility] OR[facility];  Service: Ophthalmology;  Laterality: Right;  [place] 00:50 AP% 21.4 CDE 10.79 fluid pack lot # [id number]  . COLONOSCOPY    . FOOT SURGERY    . HERNIA REPAIR      Social History Social History  Substance Use Topics  . Smoking status: Never Smoker  . Smokeless tobacco: Never Used  . Alcohol use No      Family History No bleeding or clotting disorders  No Known Allergies   REVIEW OF SYSTEMS (Negative unless checked)  Constitutional: Weight loss  Fever  Chills Cardiac: Chest pain   Chest pressure   Palpitations   Shortness of  breath when laying flat   Shortness of breath at rest   Shortness of breath with exertion. Vascular:  Pain in legs with walking   Pain in legs at rest   Pain in legs when laying flat   Claudication   Pain in feet when walking  Pain in feet at rest  Pain in feet when laying flat   History of DVT   Phlebitis   Swelling in legs   Varicose veins   Non-healing ulcers Pulmonary:   Uses home oxygen   Productive cough   Hemoptysis   Wheeze  COPD   Asthma Neurologic:  Dizziness  Blackouts   Seizures   History of stroke   History of TIA  Aphasia   Temporary blindness   Dysphagia   Weakness or numbness in arms   Weakness or numbness in legs Musculoskeletal:  Arthritis   Joint swelling   Joint pain   Low back pain Hematologic:  Easy bruising  Easy bleeding   Hypercoagulable state   Anemic   Gastrointestinal:  Blood in stool   Vomiting blood  Gastroesophageal reflux/heartburn   Abdominal pain Genitourinary:  Chronic kidney disease   Difficult urination  Frequent urination  Burning with urination   Hematuria Skin:  Rashes   Ulcers   Wounds Psychological:  History of anxiety    History of major depression.  Physical Examination  BP 124/73  Pulse 68   Resp 18   Wt 207 lb (93.9 kg)   BMI 30.57 kg/m  Gen:  WD/WN, NAD Head: [place]/AT, No temporalis wasting. Ear/Nose/Throat: Hearing grossly intact, nares w/o erythema or drainage, trachea midline Eyes: Conjunctiva clear. Sclera non-icteric Neck: Supple.  No JVD.  Pulmonary:  Good air movement, no use of accessory muscles.  Cardiac: RRR, normal S1, S2 Vascular:  Vessel Right Left  Radial Palpable Palpable                                   Gastrointestinal: soft, non-tender/non-distended. No guarding/reflex.  Musculoskeletal: M/S 5/5 throughout.  No deformity or atrophy. 1+ bilateral lower extremity edema. Neurologic: Sensation grossly intact in  extremities.  Symmetrical.  Speech is fluent.  Psychiatric: Judgment intact, Mood & affect appropriate for pt's clinical situation. Dermatologic: No rashes or ulcers noted. Previous ulcerations have healed Lymph : No Cervical, Axillary, or Inguinal lymphadenopathy.      Labs No results found for this or any previous visit (from the past 2160 hour(s)).  Radiology No results found.    Assessment/Plan  Swelling of lower extremity Reasonably stable with compression stockings. Would benefit from a lymphedema pump  Lymphedema I have discussed the utility of a lymphedema pump and recommended he use this as an adjuvant therapy to his compression stockings which she should continue. We will plan to see him back in 3-4 months.    Festus Barren, MD  08/14/2016 1:41 PM    This note was created with Dragon medical transcription system.  Any errors from dictation are purely unintentional

## 2016-08-14 NOTE — Assessment & Plan Note (Signed)
I have discussed the utility of a lymphedema pump and recommended he use this as an adjuvant therapy to his compression stockings which she should continue. We will plan to see him back in 3-4 months.

## 2016-08-14 NOTE — Assessment & Plan Note (Signed)
Reasonably stable with compression stockings. Would benefit from a lymphedema pump

## 2016-08-29 DIAGNOSIS — M25572 Pain in left ankle and joints of left foot: Secondary | ICD-10-CM | POA: Insufficient documentation

## 2016-08-29 DIAGNOSIS — M25571 Pain in right ankle and joints of right foot: Secondary | ICD-10-CM | POA: Insufficient documentation

## 2016-11-13 ENCOUNTER — Ambulatory Visit (INDEPENDENT_AMBULATORY_CARE_PROVIDER_SITE_OTHER): Payer: Medicare Other | Admitting: Vascular Surgery

## 2016-11-13 ENCOUNTER — Encounter (INDEPENDENT_AMBULATORY_CARE_PROVIDER_SITE_OTHER): Payer: Self-pay | Admitting: Vascular Surgery

## 2016-11-13 VITALS — BP 107/64 | HR 61 | Resp 16 | Ht 68.0 in | Wt 201.0 lb

## 2016-11-13 DIAGNOSIS — I890 Lymphedema, not elsewhere classified: Secondary | ICD-10-CM | POA: Diagnosis not present

## 2016-11-13 DIAGNOSIS — I10 Essential (primary) hypertension: Secondary | ICD-10-CM | POA: Diagnosis not present

## 2016-11-13 DIAGNOSIS — M7989 Other specified soft tissue disorders: Secondary | ICD-10-CM

## 2016-11-13 NOTE — Assessment & Plan Note (Signed)
blood pressure control important in reducing the progression of atherosclerotic disease. On appropriate oral medications.  

## 2016-11-13 NOTE — Assessment & Plan Note (Signed)
Reasonably stable but would clearly benefit from the lymphedema pump.  Continue stockings and elevation.  RTC 6 months

## 2016-11-13 NOTE — Progress Notes (Signed)
MRN : [id number]  Jim Guerra. is a 71 y.o. (1946-03-07) male who presents with chief complaint of  Chief Complaint  Patient presents with  . Re-evaluation    3 month lymph check, no studies  .  History of Present Illness: Patient returns today in follow up of  Leg swelling and lymphedema. Apparently insurance did not approve his lymphedema pump so he has only been going compression stockings. He still uses moisturizers and has been elevating his legs. His leg swelling is reasonably stable although he still has marked dermatitis changes, but no open ulcerations.   Current Outpatient Prescriptions  Medication Sig Dispense Refill  . aspirin EC 325 MG tablet Take by mouth.    . finasteride (PROSCAR) 5 MG tablet Take 5 mg by mouth daily.    Marland Kitchen losartan-hydrochlorothiazide (HYZAAR) 100-25 MG tablet Take 1 tablet by mouth daily.    . polyethylene glycol (MIRALAX / GLYCOLAX) packet Take by mouth.    . triamcinolone cream (KENALOG) 0.1 % Apply topically.     No current facility-administered medications for this visit.         Past Medical History:  Diagnosis Date  . Arthritis   . Environmental allergies   . HOH (hard of hearing)   . Hypertension          Past Surgical History:  Procedure Laterality Date  . CATARACT EXTRACTION W/PHACO Right 08/04/2015   Procedure: CATARACT EXTRACTION PHACO AND INTRAOCULAR LENS PLACEMENT (IOC);  Surgeon: Galen Manila, MD;  Location: [facility] OR[facility];  Service: Ophthalmology;  Laterality: Right;  [place] 00:50 AP% 21.4 CDE 10.79 fluid pack lot # [id number] H  . COLONOSCOPY    . FOOT SURGERY    . HERNIA REPAIR      Social History     Social History  Substance Use Topics  . Smoking status: Never Smoker  . Smokeless tobacco: Never Used  . Alcohol use No      Family History No bleeding or clotting disorders  No Known Allergies   REVIEW OF SYSTEMS (Negative unless checked)  Constitutional: [] Weight loss   [] Fever  [] Chills Cardiac: [] Chest pain   [] Chest pressure   [] Palpitations   [] Shortness of breath when laying flat   [] Shortness of breath at rest   [] Shortness of breath with exertion. Vascular:  [] Pain in legs with walking   [] Pain in legs at rest   [] Pain in legs when laying flat   [] Claudication   [] Pain in feet when walking  [] Pain in feet at rest  [] Pain in feet when laying flat   [x] History of DVT   [] Phlebitis   [x] Swelling in legs   [] Varicose veins   [] Non-healing ulcers Pulmonary:   [] Uses home oxygen   [] Productive cough   [] Hemoptysis   [] Wheeze  [] COPD   [] Asthma Neurologic:  [] Dizziness  [] Blackouts   [] Seizures   [] History of stroke   [] History of TIA  [] Aphasia   [] Temporary blindness   [] Dysphagia   [] Weakness or numbness in arms   [] Weakness or numbness in legs Musculoskeletal:  [] Arthritis   [] Joint swelling   [] Joint pain   [] Low back pain Hematologic:  [] Easy bruising  [] Easy bleeding   [] Hypercoagulable state   [] Anemic   Gastrointestinal:  [] Blood in stool   [] Vomiting blood  [] Gastroesophageal reflux/heartburn   [] Abdominal pain Genitourinary:  [] Chronic kidney disease   [] Difficult urination  [] Frequent urination  [] Burning with urination   [] Hematuria Skin:  [] Rashes   [] Ulcers   [] Wounds  Psychological:  [] History of anxiety   []  History of major depression.    Physical Examination  BP 107/64 (BP Location: Right Arm)   Pulse 61   Resp 16   Ht 5\' 8"  (1.727 m)   Wt 201 lb (91.2 kg)   BMI 30.56 kg/m  Gen:  WD/WN, NAD Head: [place]/AT, No temporalis wasting. Ear/Nose/Throat: Hearing grossly intact, nares w/o erythema or drainage, trachea midline Eyes: Conjunctiva clear. Sclera non-icteric Neck: Supple.  No JVD.  Pulmonary:  Good air movement, no use of accessory muscles.  Cardiac: RRR, normal S1, S2 Vascular:  Vessel Right Left  Radial Palpable Palpable                                   Musculoskeletal: M/S 5/5 throughout.  No deformity or atrophy. 1+  bilateral lower extremity edema. Moderate stasis dermatitis changes per present bilaterally without open ulcerations currently. Neurologic: Sensation grossly intact in extremities.  Symmetrical.  Speech is fluent.  Psychiatric: Judgment intact, Mood & affect appropriate for pt's clinical situation. Dermatologic: No rashes or ulcers noted.  No cellulitis or open wounds.       Labs No results found for this or any previous visit (from the past 2160 hour(s)).  Radiology No results found.   Assessment/Plan  Essential hypertension, benign blood pressure control important in reducing the progression of atherosclerotic disease. On appropriate oral medications.   Swelling of lower extremity stable  Lymphedema Reasonably stable but would clearly benefit from the lymphedema pump.  Continue stockings and elevation.  RTC 6 months    Festus BarrenJason Dew, MD  11/13/2016 11:06 AM    This note was created with Dragon medical transcription system.  Any errors from dictation are purely unintentional

## 2016-11-13 NOTE — Assessment & Plan Note (Signed)
stable °

## 2017-03-08 ENCOUNTER — Encounter: Payer: Self-pay | Admitting: *Deleted

## 2017-03-11 ENCOUNTER — Ambulatory Visit
Admission: RE | Admit: 2017-03-11 | Discharge: 2017-03-11 | Disposition: A | Discharge status: Home / Self Care | Payer: Medicare Other | Source: Ambulatory Visit | Attending: Gastroenterology | Admitting: Gastroenterology

## 2017-03-11 ENCOUNTER — Encounter: Payer: Self-pay | Admitting: *Deleted

## 2017-03-11 ENCOUNTER — Encounter
Admission: RE | Disposition: A | Discharge status: Home / Self Care | Payer: Self-pay | Source: Ambulatory Visit | Attending: Gastroenterology

## 2017-03-11 ENCOUNTER — Ambulatory Visit: Payer: Medicare Other | Admitting: Anesthesiology

## 2017-03-11 DIAGNOSIS — Z1211 Encounter for screening for malignant neoplasm of colon: Secondary | ICD-10-CM | POA: Diagnosis present

## 2017-03-11 DIAGNOSIS — Z955 Presence of coronary angioplasty implant and graft: Secondary | ICD-10-CM | POA: Diagnosis not present

## 2017-03-11 DIAGNOSIS — E119 Type 2 diabetes mellitus without complications: Secondary | ICD-10-CM | POA: Diagnosis not present

## 2017-03-11 DIAGNOSIS — I10 Essential (primary) hypertension: Secondary | ICD-10-CM | POA: Diagnosis not present

## 2017-03-11 DIAGNOSIS — M1990 Unspecified osteoarthritis, unspecified site: Secondary | ICD-10-CM | POA: Insufficient documentation

## 2017-03-11 DIAGNOSIS — Z539 Procedure and treatment not carried out, unspecified reason: Secondary | ICD-10-CM | POA: Insufficient documentation

## 2017-03-11 DIAGNOSIS — F419 Anxiety disorder, unspecified: Secondary | ICD-10-CM | POA: Diagnosis not present

## 2017-03-11 HISTORY — DX: Hyperlipidemia, unspecified: E78.5

## 2017-03-11 HISTORY — DX: Atherosclerotic heart disease of native coronary artery without angina pectoris: I25.10

## 2017-03-11 HISTORY — DX: Lymphedema, not elsewhere classified: I89.0

## 2017-03-11 HISTORY — DX: Anxiety disorder, unspecified: F41.9

## 2017-03-11 HISTORY — DX: Type 2 diabetes mellitus without complications: E11.9

## 2017-03-11 SURGERY — COLONOSCOPY WITH PROPOFOL
Anesthesia: General

## 2017-03-11 MED ORDER — PROPOFOL 10 MG/ML IV BOLUS
INTRAVENOUS | Status: AC
  Filled 2017-03-11: qty 20

## 2017-03-11 MED ORDER — PROPOFOL 500 MG/50ML IV EMUL
INTRAVENOUS | Status: AC
  Filled 2017-03-11: qty 50

## 2017-03-11 MED ORDER — LIDOCAINE HCL (PF) 2 % IJ SOLN
INTRAMUSCULAR | Status: AC
  Filled 2017-03-11: qty 10

## 2017-03-11 NOTE — Anesthesia Preprocedure Evaluation (Signed)
Anesthesia Evaluation  Patient identified by MRN, date of birth, ID band Patient awake    Reviewed: Allergy & Precautions, NPO status , Patient's Chart, lab work & pertinent test results  History of Anesthesia Complications Negative for: history of anesthetic complications  Airway Mallampati: II  TM Distance: >3 FB Neck ROM: Full    Dental  (+) Poor Dentition   Pulmonary neg pulmonary ROS, neg sleep apnea, neg COPD,    breath sounds clear to auscultation- rhonchi (-) wheezing      Cardiovascular hypertension, Pt. on medications (-) Past MI, (-) Cardiac Stents and (-) CABG  Rhythm:Regular Rate:Normal - Systolic murmurs and - Diastolic murmurs    Neuro/Psych Anxiety negative neurological ROS     GI/Hepatic negative GI ROS, Neg liver ROS,   Endo/Other  diabetes  Renal/GU negative Renal ROS     Musculoskeletal  (+) Arthritis ,   Abdominal (+) + obese,   Peds  Hematology negative hematology ROS (+)   Anesthesia Other Findings Past Medical History: No date: Anxiety No date: Arthritis No date: Coronary artery disease No date: Diabetes mellitus without complication (HCC) No date: Elevated lipids No date: Environmental allergies No date: HOH (hard of hearing) No date: Hypertension No date: Lymphedema of both lower extremities   Reproductive/Obstetrics                             Anesthesia Physical Anesthesia Plan  ASA: II  Anesthesia Plan: General   Post-op Pain Management:    Induction: Intravenous  PONV Risk Score and Plan: 2 and Propofol infusion  Airway Management Planned: Natural Airway  Additional Equipment:   Intra-op Plan:   Post-operative Plan:   Informed Consent: I have reviewed the patients History and Physical, chart, labs and discussed the procedure including the risks, benefits and alternatives for the proposed anesthesia with the patient or authorized  representative who has indicated his/her understanding and acceptance.   Dental advisory given  Plan Discussed with: CRNA and Anesthesiologist  Anesthesia Plan Comments:         Anesthesia Quick Evaluation

## 2017-03-11 NOTE — H&P (Signed)
After arrival, patient stated he had eaten solid meal yesterday.  On rectal exam there was a mucoid stool.  Will recommend re-prep and rescheduling with strict liquids only the day before the procedure.

## 2017-05-14 ENCOUNTER — Encounter (INDEPENDENT_AMBULATORY_CARE_PROVIDER_SITE_OTHER): Payer: Self-pay | Admitting: Vascular Surgery

## 2017-05-14 ENCOUNTER — Ambulatory Visit (INDEPENDENT_AMBULATORY_CARE_PROVIDER_SITE_OTHER): Payer: Medicare Other | Admitting: Vascular Surgery

## 2017-05-14 VITALS — BP 120/70 | HR 59 | Resp 17 | Ht 72.0 in | Wt 207.0 lb

## 2017-05-14 DIAGNOSIS — I10 Essential (primary) hypertension: Secondary | ICD-10-CM

## 2017-05-14 DIAGNOSIS — L03119 Cellulitis of unspecified part of limb: Secondary | ICD-10-CM

## 2017-05-14 DIAGNOSIS — I890 Lymphedema, not elsewhere classified: Secondary | ICD-10-CM

## 2017-05-14 NOTE — Progress Notes (Signed)
Subjective:    Patient ID: Jim FiguresMorris E Boggan Jr., male    DOB: 05-03-1946, 72 y.o.   MRN: [id number] Chief Complaint  Patient presents with  . Follow-up    6 month follow up   The patient presents for a 2572-month lymphedema follow-up.  The patient notes that he does not wear his medical grade compression stockings on a daily basis as when he is out in the yard working they hurt him.  The patient is only elevating his legs at night while he is sleeping on a pillow.  Our clinic has applied for a lymphedema pump in the past for the patient however it was denied.  The patient feels his lymphedema is being controlled by his current regimen at this time.  The patient denies any claudication-like symptoms, rest pain or ulceration to the lower extremity.  The patient denies any fever, nausea or vomiting.   Review of Systems  Constitutional: Negative.   HENT: Negative.   Eyes: Negative.   Respiratory: Negative.   Cardiovascular: Positive for leg swelling.  Gastrointestinal: Negative.   Endocrine: Negative.   Genitourinary: Negative.   Musculoskeletal: Negative.   Skin: Negative.   Allergic/Immunologic: Negative.   Neurological: Negative.   Hematological: Negative.   Psychiatric/Behavioral: Negative.       Objective:   Physical Exam  Constitutional: He is oriented to person, place, and time. He appears well-developed and well-nourished. No distress.  HENT:  Head: Normocephalic and atraumatic.  Eyes: Conjunctivae are normal. Pupils are equal, round, and reactive to light.  Neck: Normal range of motion.  Cardiovascular: Normal rate, regular rhythm, normal heart sounds and intact distal pulses.  Pulses:      Radial pulses are 2+ on the right side, and 2+ on the left side.  Hard to palpate pedal pulses due to body habitus and edema  Pulmonary/Chest: Effort normal and breath sounds normal.  Musculoskeletal: Normal range of motion. He exhibits edema (Moderate nonpitting edema noted  bilaterally).  Neurological: He is alert and oriented to person, place, and time.  Skin: He is not diaphoretic.  Severe stasis dermatitis noted bilaterally.  Excoriation markings noted to the bilateral calves.  Skin thickening is noted bilaterally.  It is relatively intact and there are no active ulcerations  Psychiatric: He has a normal mood and affect. His behavior is normal. Judgment and thought content normal.  Vitals reviewed.  BP 120/70 (BP Location: Left Arm, Patient Position: Sitting)   Pulse (!) 59   Resp 17   Ht 6' (1.829 m)   Wt 207 lb (93.9 kg)   BMI 28.07 kg/m   Past Medical History:  Diagnosis Date  . Anxiety   . Arthritis   . Coronary artery disease   . Diabetes mellitus without complication (HCC)   . Elevated lipids   . Environmental allergies   . HOH (hard of hearing)   . Hypertension   . Lymphedema of both lower extremities    Social History   Socioeconomic History  . Marital status: Married    Spouse name: Not on file  . Number of children: Not on file  . Years of education: Not on file  . Highest education level: Not on file  Social Needs  . Financial resource strain: Not on file  . Food insecurity - worry: Not on file  . Food insecurity - inability: Not on file  . Transportation needs - medical: Not on file  . Transportation needs - non-medical: Not on file  Occupational  History  . Not on file  Tobacco Use  . Smoking status: Never Smoker  . Smokeless tobacco: Never Used  Substance and Sexual Activity  . Alcohol use: No  . Drug use: No  . Sexual activity: Not on file  Other Topics Concern  . Not on file  Social History Narrative  . Not on file   Past Surgical History:  Procedure Laterality Date  . CATARACT EXTRACTION W/PHACO Right 08/04/2015   Procedure: CATARACT EXTRACTION PHACO AND INTRAOCULAR LENS PLACEMENT (IOC);  Surgeon: Galen Manila, MD;  Location: [facility] ORS;  Service: Ophthalmology;  Laterality: Right;  [place] 00:50 AP% 21.4 CDE  10.79 fluid pack lot # [id number]  . COLONOSCOPY    . FOOT SURGERY    . HERNIA REPAIR     History reviewed. No pertinent family history.  No Known Allergies     Assessment & Plan:  The patient presents for a 45-month lymphedema follow-up.  The patient notes that he does not wear his medical grade compression stockings on a daily basis as when he is out in the yard working they hurt him.  The patient is only elevating his legs at night while he is sleeping on a pillow.  Our clinic has applied for a lymphedema pump in the past for the patient however it was denied.  The patient feels his lymphedema is being controlled by his current regimen at this time.  The patient denies any claudication-like symptoms, rest pain or ulceration to the lower extremity.  The patient denies any fever, nausea or vomiting.  1. Lymphedema - Stable I encouraged the patient to be more compliant with conservative therapy. The patient is to wear his medical grade 1 compression stockings on a daily basis.  He should place them in the a.m. and remove them in the p.m. The patient was encouraged to elevate his legs heart level or higher during the day and at night I discussed reapplying for the lymphedema pump however at this time the patient is refusing We will bring the patient back in 6 months for a lymphedema check and an ABI to check his arterial status as he has multiple risk factors for peripheral artery disease  2. Cellulitis of lower extremity, unspecified laterality - None At this time, there is no active cellulitis to the bilateral lower extremity  3. Essential hypertension, benign - Stable Encouraged good control as its slows the progression of atherosclerotic disease  Current Outpatient Medications on File Prior to Visit  Medication Sig Dispense Refill  . aspirin EC 81 MG tablet Take by mouth.    . finasteride (PROSCAR) 5 MG tablet Take 5 mg by mouth daily.    Marland Kitchen losartan-hydrochlorothiazide (HYZAAR) 100-25  MG tablet Take 1 tablet by mouth daily.    . polyethylene glycol (MIRALAX / GLYCOLAX) packet Take by mouth.    . triamcinolone cream (KENALOG) 0.5 % Apply 1 application topically 3 (three) times daily.    . valsartan (DIOVAN) 160 MG tablet Take 160 mg by mouth.     No current facility-administered medications on file prior to visit.    There are no Patient Instructions on file for this visit. No Follow-up on file.  Justina Bertini A Marny Smethers, PA-C

## 2017-05-15 ENCOUNTER — Ambulatory Visit (INDEPENDENT_AMBULATORY_CARE_PROVIDER_SITE_OTHER): Payer: Medicare Other | Admitting: Vascular Surgery

## 2017-11-11 ENCOUNTER — Encounter (INDEPENDENT_AMBULATORY_CARE_PROVIDER_SITE_OTHER): Payer: Medicare Other

## 2017-11-11 ENCOUNTER — Ambulatory Visit (INDEPENDENT_AMBULATORY_CARE_PROVIDER_SITE_OTHER): Payer: Medicare Other | Admitting: Vascular Surgery

## 2017-12-02 ENCOUNTER — Other Ambulatory Visit (INDEPENDENT_AMBULATORY_CARE_PROVIDER_SITE_OTHER): Payer: Self-pay | Admitting: Vascular Surgery

## 2017-12-02 DIAGNOSIS — I890 Lymphedema, not elsewhere classified: Secondary | ICD-10-CM

## 2017-12-02 DIAGNOSIS — R0989 Other specified symptoms and signs involving the circulatory and respiratory systems: Secondary | ICD-10-CM

## 2017-12-03 ENCOUNTER — Encounter

## 2017-12-03 ENCOUNTER — Ambulatory Visit (INDEPENDENT_AMBULATORY_CARE_PROVIDER_SITE_OTHER): Payer: Medicare Other

## 2017-12-03 ENCOUNTER — Ambulatory Visit (INDEPENDENT_AMBULATORY_CARE_PROVIDER_SITE_OTHER): Payer: Medicare Other | Admitting: Nurse Practitioner

## 2017-12-03 ENCOUNTER — Encounter (INDEPENDENT_AMBULATORY_CARE_PROVIDER_SITE_OTHER): Payer: Self-pay | Admitting: Vascular Surgery

## 2017-12-03 VITALS — BP 136/70 | HR 73 | Resp 18 | Ht 68.0 in | Wt 213.0 lb

## 2017-12-03 DIAGNOSIS — L97302 Non-pressure chronic ulcer of unspecified ankle with fat layer exposed: Secondary | ICD-10-CM

## 2017-12-03 DIAGNOSIS — R0989 Other specified symptoms and signs involving the circulatory and respiratory systems: Secondary | ICD-10-CM | POA: Diagnosis not present

## 2017-12-03 DIAGNOSIS — I890 Lymphedema, not elsewhere classified: Secondary | ICD-10-CM | POA: Diagnosis not present

## 2017-12-03 DIAGNOSIS — I83003 Varicose veins of unspecified lower extremity with ulcer of ankle: Secondary | ICD-10-CM | POA: Diagnosis not present

## 2017-12-03 DIAGNOSIS — I739 Peripheral vascular disease, unspecified: Secondary | ICD-10-CM

## 2017-12-03 NOTE — Progress Notes (Signed)
Subjective:    Patient ID: Jim FiguresMorris E Balster Jr., male    DOB: 07-04-1945, 72 y.o.   MRN: [id number] Chief Complaint  Patient presents with  . Follow-up    6 month ABI, Lymphedema    HPI   Mr. Jim Guerra is a 72 year old male, that presents for 10476-month follow-up for lymphedema as well as ABI assessment.  The patient reports that he has been intermittently compliant with conservative therapy.  He reports wearing his compression socks mainly only on weekends.  He has declined lymphedema pump due to cost, because of insurance denial.   We also performed an ABI on Mr. Jim Guerra today to establish a baseline, due to his need for [place]na boots in the past.  Current guidelines suggest that the patient should not be in any boots with an ABI of less than 0.4, therefore this is to establish baseline should placement be needing in the future.he reports intermittently elevating his legs during the evening.  He denies having any wounds, weeping, pain, or  Cellulitis.  He denies having any pain at rest, intermittent claudication, or ulcerations.  He denies any nausea, fever, vomiting, or diarrhea.  Constitutional: [] Weight loss  [] Fever  [] Chills Cardiac: [] Chest pain   [] Chest pressure   [] Palpitations   [] Shortness of breath when laying flat   [] Shortness of breath with exertion. Vascular:  [] Pain in legs with walking   [] Pain in legs with standing  [] History of DVT   [] Phlebitis   [x] Swelling in legs   [x] Varicose veins   [] Non-healing ulcers Pulmonary:   [] Uses home oxygen   [] Productive cough   [] Hemoptysis   [] Wheeze  [] COPD   [] Asthma Neurologic:  [] Dizziness   [] Seizures   [] History of stroke   [] History of TIA  [] Aphasia   [] Vissual changes   [] Weakness or numbness in arm   [] Weakness or numbness in leg Musculoskeletal:   [] Joint swelling   [] Joint pain   [] Low back pain Hematologic:  [] Easy bruising  [] Easy bleeding   [] Hypercoagulable state   [] Anemic Gastrointestinal:  [] Diarrhea   [] Vomiting   [] Gastroesophageal reflux/heartburn   [] Difficulty swallowing. Genitourinary:  [] Chronic kidney disease   [] Difficult urination  [] Frequent urination   [] Blood in urine Skin:  [] Rashes    [] Ulcers  Psychological:  [] History of anxiety   []  History of major depression.     Objective:   Physical Exam    BP 136/70 (BP Location: Right Arm, Patient Position: Sitting)   Pulse 73   Resp 18   Ht 5\' 8"  (1.727 m)   Wt 213 lb (96.6 kg)   BMI 32.39 kg/m   Past Medical History:  Diagnosis Date  . Anxiety   . Arthritis   . Coronary artery disease   . Diabetes mellitus without complication (HCC)   . Elevated lipids   . Environmental allergies   . HOH (hard of hearing)   . Hypertension   . Lymphedema of both lower extremities      Gen: WD/WN, NAD Head: [place]/AT, No temporalis wasting.  Ear/Nose/Throat: Hearing grossly intact, nares w/o erythema or drainage, poor dentition Eyes: PER, EOMI, sclera nonicteric.  Neck: Supple, no masses.  No bruit or JVD.  Pulmonary:  Good air movement, clear to auscultation bilaterally, no use of accessory muscles.  Cardiac: RRR, normal S1, S2, no Murmurs. Vascular: Large varicosities present-present.  Mild venous stasis changes to the legs bilaterally.  2+ edema on RLE, 1 + on LLE Vessel Right Left  PT 2+ 2+  Gastrointestinal: soft, non-distended. No guarding/no peritoneal signs.  Musculoskeletal: M/S 5/5 throughout.  No deformity or atrophy.  Neurologic: CN 2-12 intact. Pain and light touch intact in extremities.  Symmetrical.  Speech is fluent. Motor exam as listed above. Psychiatric: Judgment intact, Mood & affect appropriate for pt's clinical situation. Dermatologic: Venous rashes no ulcers noted.  No changes consistent with cellulitis. Lymph : No Cervical lymphadenopathy, skin changes of chronic lymphedema present.   Social History   Socioeconomic History  . Marital status: Married    Spouse name: Not on file  . Number of children: Not on file    . Years of education: Not on file  . Highest education level: Not on file  Occupational History  . Not on file  Social Needs  . Financial resource strain: Not on file  . Food insecurity:    Worry: Not on file    Inability: Not on file  . Transportation needs:    Medical: Not on file    Non-medical: Not on file  Tobacco Use  . Smoking status: Never Smoker  . Smokeless tobacco: Never Used  Substance and Sexual Activity  . Alcohol use: No  . Drug use: No  . Sexual activity: Not on file  Lifestyle  . Physical activity:    Days per week: Not on file    Minutes per session: Not on file  . Stress: Not on file  Relationships  . Social connections:    Talks on phone: Not on file    Gets together: Not on file    Attends religious service: Not on file    Active member of club or organization: Not on file    Attends meetings of clubs or organizations: Not on file    Relationship status: Not on file  . Intimate partner violence:    Fear of current or ex partner: Not on file    Emotionally abused: Not on file    Physically abused: Not on file    Forced sexual activity: Not on file  Other Topics Concern  . Not on file  Social History Narrative  . Not on file    Past Surgical History:  Procedure Laterality Date  . CATARACT EXTRACTION W/PHACO Right 08/04/2015   Procedure: CATARACT EXTRACTION PHACO AND INTRAOCULAR LENS PLACEMENT (IOC);  Surgeon: Galen Manila, MD;  Location: [facility] ORS;  Service: Ophthalmology;  Laterality: Right;  [place] 00:50 AP% 21.4 CDE 10.79 fluid pack lot # [id number]  . COLONOSCOPY    . FOOT SURGERY    . HERNIA REPAIR      History reviewed. No pertinent family history.  No Known Allergies     Assessment & Plan:  Jim Guerra is a 72 year old male, that presents for 36-month follow-up for lymphedema as well as ABI assessment.  The patient reports that he has been intermittently compliant with conservative therapy.  He reports wearing his compression socks  mainly only on weekends.  He has declined lymphedema pump due to cost, because of insurance denial.   He reports intermittently elevating his legs during the evening.  He denies having any wounds, weeping, pain, or  Cellulitis.  He denies having any pain at rest, intermittent claudication, or ulcerations.  He denies any nausea, fever, vomiting, or diarrhea.   Current Outpatient Medications on File Prior to Visit  Medication Sig Dispense Refill  . aspirin EC 81 MG tablet Take by mouth.    . finasteride (PROSCAR) 5 MG tablet Take 5 mg by mouth daily.    Marland Kitchen  losartan-hydrochlorothiazide (HYZAAR) 100-25 MG tablet Take 1 tablet by mouth daily.    . polyethylene glycol (MIRALAX / GLYCOLAX) packet Take by mouth.    . triamcinolone cream (KENALOG) 0.5 % Apply 1 application topically 3 (three) times daily.    . valsartan (DIOVAN) 160 MG tablet Take 160 mg by mouth.     No current facility-administered medications on file prior to visit.    1. Lymphedema Mr. Brouwer will continue to try to do conservative therapy to manage his lymphedema.  He is encouraged to continue to use compression socks, whenever outside of the house while doing yard work etc.  He was specifically instructed not to wear the socks while he is sleeping.  He was also specifically instructed that he did not need to utilize pillows to elevate his feet while he was sleeping.  Currently, his swelling is well controlled, with no evidence of weeping or ulceration.  The patient will follow up with [place] in 1 year or sooner should his swelling become out of control, begin the or develop ulcerations such as what he had the past.  2. 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 Kittel legs currently look free of any venous stasis ulcers, which necessitated placement in [employer] previously.  He is advised to return to the office sooner than his 1 year follow-up time should these ulcers  redevelop.  3. Peripheral vascular disease (HCC)  The ABI revealed an ABI of 1.18 in the right and 1.11 in the left.  While these ABIs were within normal range there were mildly dampened toe-brachial waveforms on the right.  Also, the right ATA is showing biphasic waveforms, as well as the bilateral PTA is showing biphasic waveforms.  Therefore, we will continue to follow Mr. Sylve ABIs annually.    - VAS [place] ABI WITH/WO TBI; Future     There are no Patient Instructions on file for this visit. No follow-ups on file.   Georgiana Spinner, NP

## 2018-01-13 ENCOUNTER — Ambulatory Visit (INDEPENDENT_AMBULATORY_CARE_PROVIDER_SITE_OTHER): Payer: Medicare Other | Admitting: Vascular Surgery

## 2018-01-13 ENCOUNTER — Encounter (INDEPENDENT_AMBULATORY_CARE_PROVIDER_SITE_OTHER): Payer: Medicare Other

## 2018-12-02 ENCOUNTER — Encounter (INDEPENDENT_AMBULATORY_CARE_PROVIDER_SITE_OTHER): Payer: Self-pay | Admitting: Nurse Practitioner

## 2018-12-02 ENCOUNTER — Encounter (INDEPENDENT_AMBULATORY_CARE_PROVIDER_SITE_OTHER): Payer: Self-pay

## 2018-12-02 ENCOUNTER — Ambulatory Visit (INDEPENDENT_AMBULATORY_CARE_PROVIDER_SITE_OTHER): Payer: Medicare Other

## 2018-12-02 ENCOUNTER — Ambulatory Visit (INDEPENDENT_AMBULATORY_CARE_PROVIDER_SITE_OTHER): Payer: Medicare Other | Admitting: Nurse Practitioner

## 2018-12-02 ENCOUNTER — Other Ambulatory Visit: Payer: Self-pay

## 2018-12-02 VITALS — BP 132/75 | HR 63 | Resp 16 | Ht 68.0 in | Wt 203.0 lb

## 2018-12-02 DIAGNOSIS — Z79899 Other long term (current) drug therapy: Secondary | ICD-10-CM | POA: Diagnosis not present

## 2018-12-02 DIAGNOSIS — I739 Peripheral vascular disease, unspecified: Secondary | ICD-10-CM

## 2018-12-02 DIAGNOSIS — I10 Essential (primary) hypertension: Secondary | ICD-10-CM

## 2018-12-02 DIAGNOSIS — I890 Lymphedema, not elsewhere classified: Secondary | ICD-10-CM | POA: Diagnosis not present

## 2018-12-03 ENCOUNTER — Encounter (INDEPENDENT_AMBULATORY_CARE_PROVIDER_SITE_OTHER): Payer: Self-pay | Admitting: Nurse Practitioner

## 2018-12-03 NOTE — Progress Notes (Signed)
SUBJECTIVE:  Patient ID: Jim Laws., male    DOB: [birth year], 73 y.o.   MRN: [id number] Chief Complaint  Patient presents with  . Follow-up    HPI  Jim [place]. is a 73 y.o. male that presents today for follow-up of his lymphedema as well as noninvasive studies for peripheral artery disease.  Currently the patient does endorse not wearing medical grade 1 compression due to not being able to tolerate it due to the pain that cuts into his calf.  However he has been diligent about elevating his lower extremities as well as to make sure his legs are well moisturized to prevent any cracks or ulcerations.  Overall he appears to be doing well with maintaining his swelling.  He denies any recent ulcerations or any signs or symptoms of cellulitis.  He denies any fever, chills, nausea, vomiting or diarrhea.  He denies any chest pain or spread.  He denies any claudication-like symptoms rest pain.  Today the patient underwent ABIs which revealed an ABI 1.15 on the right 1.07 on the left.  The patient has triphasic tibial artery waveforms bilaterally.  Great toe waveforms are normal.  Previous studies done on 12/03/2017 reveal biphasic tibial artery waveforms with some dampened toe waveforms in the right foot.  Past Medical History:  Diagnosis Date  . Anxiety   . Arthritis   . Coronary artery disease   . Diabetes mellitus without complication ([place])   . Elevated lipids   . Environmental allergies   . HOH (hard of hearing)   . Hypertension   . Lymphedema of both lower extremities     Past Surgical History:  Procedure Laterality Date  . CATARACT EXTRACTION W/PHACO Right 08/04/2015   Procedure: CATARACT EXTRACTION PHACO AND INTRAOCULAR LENS PLACEMENT (IOC);  Surgeon: Birder Robson, MD;  Location: [facility] ORS;  Service: Ophthalmology;  Laterality: Right;  [place] 00:50 AP% 21.4 CDE 10.79 fluid pack lot # [id number]  . COLONOSCOPY    . FOOT SURGERY    . HERNIA REPAIR      Social  History   Socioeconomic History  . Marital status: Married    Spouse name: Not on file  . Number of children: Not on file  . Years of education: Not on file  . Highest education level: Not on file  Occupational History  . Not on file  Social Needs  . Financial resource strain: Not on file  . Food insecurity    Worry: Not on file    Inability: Not on file  . Transportation needs    Medical: Not on file    Non-medical: Not on file  Tobacco Use  . Smoking status: Never Smoker  . Smokeless tobacco: Never Used  Substance and Sexual Activity  . Alcohol use: No  . Drug use: No  . Sexual activity: Not on file  Lifestyle  . Physical activity    Days per week: Not on file    Minutes per session: Not on file  . Stress: Not on file  Relationships  . Social Herbalist on phone: Not on file    Gets together: Not on file    Attends religious service: Not on file    Active member of club or organization: Not on file    Attends meetings of clubs or organizations: Not on file    Relationship status: Not on file  . Intimate partner violence    Fear of current or ex partner: Not  on file    Emotionally abused: Not on file    Physically abused: Not on file    Forced sexual activity: Not on file  Other Topics Concern  . Not on file  Social History Narrative  . Not on file    History reviewed. No pertinent family history.  No Known Allergies   Review of Systems   Review of Systems: Negative Unless Checked Constitutional: [] Weight loss  [] Fever  [] Chills Cardiac: [] Chest pain   []  Atrial Fibrillation  [] Palpitations   [] Shortness of breath when laying flat   [] Shortness of breath with exertion. [] Shortness of breath at rest Vascular:  [] Pain in legs with walking   [] Pain in legs with standing [] Pain in legs when laying flat   [] Claudication    [] Pain in feet when laying flat    [] History of DVT   [] Phlebitis   [x] Swelling in legs   [x] Varicose veins   [] Non-healing ulcers  Pulmonary:   [] Uses home oxygen   [] Productive cough   [] Hemoptysis   [] Wheeze  [] COPD   [] Asthma Neurologic:  [] Dizziness   [] Seizures  [] Blackouts [] History of stroke   [] History of TIA  [] Aphasia   [] Temporary Blindness   [] Weakness or numbness in arm   [] Weakness or numbness in leg Musculoskeletal:   [] Joint swelling   [] Joint pain   [] Low back pain  []  History of Knee Replacement [x] Arthritis [] back Surgeries  []  Spinal Stenosis    Hematologic:  [] Easy bruising  [] Easy bleeding   [] Hypercoagulable state   [] Anemic Gastrointestinal:  [] Diarrhea   [] Vomiting  [] Gastroesophageal reflux/heartburn   [] Difficulty swallowing. [] Abdominal pain Genitourinary:  [] Chronic kidney disease   [] Difficult urination  [] Anuric   [] Blood in urine [] Frequent urination  [] Burning with urination   [] Hematuria Skin:  [] Rashes   [] Ulcers [] Wounds Psychological:  [x] History of anxiety   []  History of major depression  []  Memory Difficulties      OBJECTIVE:   Physical Exam  BP 132/75 (BP Location: Left Arm, Patient Position: Sitting, Cuff Size: Normal)   Pulse 63   Resp 16   Ht 5\' 8"  (1.727 m)   Wt 203 lb (92.1 kg)   BMI 30.87 kg/m   Gen: WD/WN, NAD Head: /AT, No temporalis wasting.  Ear/Nose/Throat: Hearing grossly intact, nares w/o erythema or drainage Eyes: PER, EOMI, sclera nonicteric.  Neck: Supple, no masses.  No JVD.  Pulmonary:  Good air movement, no use of accessory muscles.  Cardiac: RRR Vascular:  2+ edema bilaterally Vessel Right Left  Radial Palpable Palpable  Dorsalis Pedis Palpable Palpable  Posterior Tibial Palpable Palpable   Gastrointestinal: soft, non-distended. No guarding/no peritoneal signs.  Musculoskeletal: M/S 5/5 throughout.  No deformity or atrophy.  Neurologic: Pain and light touch intact in extremities.  Symmetrical.  Speech is fluent. Motor exam as listed above. Psychiatric: Judgment intact, Mood & affect appropriate for pt's clinical situation. Dermatologic:   Stasis dermatitis bilaterally. No Ulcers Noted.  No changes consistent with cellulitis. Lymph : No Cervical lymphadenopathy, no lichenification or skin changes of chronic lymphedema.       ASSESSMENT AND PLAN:  1. Lymphedema  No surgery or intervention at this point in time.    I have reviewed my previous discussion with the patient regarding swelling and why it  causes symptoms.  The patient is doing well with compression and will continue wearing graduated compression stockings class 1 (20-30 mmHg) on a daily basis a prescription was given. The patient will  continue wearing the  stockings first thing in the morning and removing them in the evening. The patient is instructed specifically not to sleep in the stockings.    In addition, behavioral modification including elevation during the day and exercise will be continued.    Patient should follow-up on an annual basis   2. Essential hypertension, benign Continue antihypertensive medications as already ordered, these medications have been reviewed and there are no changes at this time.   3. PAD (peripheral artery disease) (HCC)  Today the patient underwent ABIs which revealed an ABI 1.15 on the right 1.07 on the left.  The patient has triphasic tibial artery waveforms bilaterally.  Great toe waveforms are normal.  Previous studies done on 12/03/2017 reveal biphasic tibial artery waveforms with some dampened toe waveforms in the right foot.  Noninvasive studies today do not indicate extensive peripheral arterial disease.  However studies from a year ago had biphasic waveforms with some dampened toe waveforms therefore believe it is prudent for us to continue to follow the patient to ensure that there are no abrupt changes that are not detected. - VAS US ABI WITH/WO TBI; Future   Current Outpatient Medications on File Prior to Visit  Medication Sig Dispense Refill  . aspirin EC 81 MG tablet Take by mouth.    . finasteride (PROSCAR) 5 MG  tablet Take 5 mg by mouth daily.    Marland Kitchen. losartan-hydrochlorothiazide (HYZAAR) 100-25 MG tablet Take 1 tablet by mouth daily.    . polyethylene glycol (MIRALAX / GLYCOLAX) packet Take by mouth.    . valsartan (DIOVAN) 160 MG tablet Take 160 mg by mouth.    . triamcinolone cream (KENALOG) 0.5 % Apply 1 application topically 3 (three) times daily.     No current facility-administered medications on file prior to visit.     There are no Patient Instructions on file for this visit. Return in about 1 year (around 12/02/2019) for Lymphedema, PAD.   Georgiana SpinnerFallon E Dionta Larke, NP  This note was completed with Office managerDragon Dictation.  Any errors are purely unintentional.

## 2019-02-16 ENCOUNTER — Emergency Department: Payer: Medicare Other

## 2019-02-16 ENCOUNTER — Other Ambulatory Visit: Payer: Self-pay

## 2019-02-16 ENCOUNTER — Inpatient Hospital Stay
Admission: EM | Admit: 2019-02-16 | Discharge: 2019-02-19 | DRG: 683 | Disposition: A | Discharge status: Skilled Nursing Facility | Payer: Medicare Other | Attending: Internal Medicine | Admitting: Internal Medicine

## 2019-02-16 DIAGNOSIS — H9190 Unspecified hearing loss, unspecified ear: Secondary | ICD-10-CM | POA: Diagnosis present

## 2019-02-16 DIAGNOSIS — N179 Acute kidney failure, unspecified: Secondary | ICD-10-CM | POA: Diagnosis not present

## 2019-02-16 DIAGNOSIS — E872 Acidosis: Secondary | ICD-10-CM | POA: Diagnosis present

## 2019-02-16 DIAGNOSIS — I10 Essential (primary) hypertension: Secondary | ICD-10-CM | POA: Diagnosis present

## 2019-02-16 DIAGNOSIS — Z20828 Contact with and (suspected) exposure to other viral communicable diseases: Secondary | ICD-10-CM | POA: Diagnosis present

## 2019-02-16 DIAGNOSIS — N138 Other obstructive and reflux uropathy: Secondary | ICD-10-CM | POA: Diagnosis present

## 2019-02-16 DIAGNOSIS — Z8249 Family history of ischemic heart disease and other diseases of the circulatory system: Secondary | ICD-10-CM | POA: Diagnosis not present

## 2019-02-16 DIAGNOSIS — N1330 Unspecified hydronephrosis: Secondary | ICD-10-CM | POA: Diagnosis present

## 2019-02-16 DIAGNOSIS — R52 Pain, unspecified: Secondary | ICD-10-CM

## 2019-02-16 DIAGNOSIS — Z825 Family history of asthma and other chronic lower respiratory diseases: Secondary | ICD-10-CM | POA: Diagnosis not present

## 2019-02-16 DIAGNOSIS — E1151 Type 2 diabetes mellitus with diabetic peripheral angiopathy without gangrene: Secondary | ICD-10-CM | POA: Diagnosis present

## 2019-02-16 DIAGNOSIS — E875 Hyperkalemia: Secondary | ICD-10-CM | POA: Diagnosis present

## 2019-02-16 DIAGNOSIS — J90 Pleural effusion, not elsewhere classified: Secondary | ICD-10-CM | POA: Diagnosis present

## 2019-02-16 DIAGNOSIS — Z7982 Long term (current) use of aspirin: Secondary | ICD-10-CM | POA: Diagnosis not present

## 2019-02-16 DIAGNOSIS — J9811 Atelectasis: Secondary | ICD-10-CM | POA: Diagnosis present

## 2019-02-16 DIAGNOSIS — I2510 Atherosclerotic heart disease of native coronary artery without angina pectoris: Secondary | ICD-10-CM | POA: Diagnosis present

## 2019-02-16 DIAGNOSIS — N401 Enlarged prostate with lower urinary tract symptoms: Secondary | ICD-10-CM | POA: Diagnosis present

## 2019-02-16 DIAGNOSIS — E876 Hypokalemia: Secondary | ICD-10-CM | POA: Diagnosis present

## 2019-02-16 DIAGNOSIS — Z79899 Other long term (current) drug therapy: Secondary | ICD-10-CM | POA: Diagnosis not present

## 2019-02-16 LAB — HEMOGLOBIN A1C
Hgb A1c MFr Bld: 7 % — ABNORMAL HIGH (ref 4.8–5.6)
Mean Plasma Glucose: 154.2 mg/dL

## 2019-02-16 LAB — URINALYSIS, COMPLETE (UACMP) WITH MICROSCOPIC
Bacteria, UA: NONE SEEN
Bilirubin Urine: NEGATIVE
Glucose, UA: NEGATIVE mg/dL
Hgb urine dipstick: NEGATIVE
Ketones, ur: NEGATIVE mg/dL
Leukocytes,Ua: NEGATIVE
Nitrite: NEGATIVE
Protein, ur: NEGATIVE mg/dL
Specific Gravity, Urine: 1.012 (ref 1.005–1.030)
pH: 5 (ref 5.0–8.0)

## 2019-02-16 LAB — COMPREHENSIVE METABOLIC PANEL WITH GFR
ALT: 9 U/L (ref 0–44)
AST: 8 U/L — ABNORMAL LOW (ref 15–41)
Albumin: 3.2 g/dL — ABNORMAL LOW (ref 3.5–5.0)
Alkaline Phosphatase: 54 U/L (ref 38–126)
Anion gap: 23 — ABNORMAL HIGH (ref 5–15)
BUN: 205 mg/dL — ABNORMAL HIGH (ref 8–23)
CO2: 13 mmol/L — ABNORMAL LOW (ref 22–32)
Calcium: 7.4 mg/dL — ABNORMAL LOW (ref 8.9–10.3)
Chloride: 96 mmol/L — ABNORMAL LOW (ref 98–111)
Creatinine, Ser: 15.94 mg/dL — ABNORMAL HIGH (ref 0.61–1.24)
GFR calc Af Amer: 3 mL/min — ABNORMAL LOW
GFR calc non Af Amer: 3 mL/min — ABNORMAL LOW
Glucose, Bld: 140 mg/dL — ABNORMAL HIGH (ref 70–99)
Potassium: 6.5 mmol/L (ref 3.5–5.1)
Sodium: 132 mmol/L — ABNORMAL LOW (ref 135–145)
Total Bilirubin: 1 mg/dL (ref 0.3–1.2)
Total Protein: 7.4 g/dL (ref 6.5–8.1)

## 2019-02-16 LAB — BASIC METABOLIC PANEL
Anion gap: 21 — ABNORMAL HIGH (ref 5–15)
BUN: 181 mg/dL — ABNORMAL HIGH (ref 8–23)
CO2: 13 mmol/L — ABNORMAL LOW (ref 22–32)
Calcium: 7.6 mg/dL — ABNORMAL LOW (ref 8.9–10.3)
Chloride: 100 mmol/L (ref 98–111)
Creatinine, Ser: 12.57 mg/dL — ABNORMAL HIGH (ref 0.61–1.24)
GFR calc Af Amer: 4 mL/min — ABNORMAL LOW (ref >60)
GFR calc non Af Amer: 3 mL/min — ABNORMAL LOW (ref >60)
Glucose, Bld: 74 mg/dL (ref 70–99)
Potassium: 4.7 mmol/L (ref 3.5–5.1)
Sodium: 134 mmol/L — ABNORMAL LOW (ref 135–145)

## 2019-02-16 LAB — CBC
HCT: 41.7 % (ref 39.0–52.0)
Hemoglobin: 14.2 g/dL (ref 13.0–17.0)
MCH: 30.3 pg (ref 26.0–34.0)
MCHC: 34.1 g/dL (ref 30.0–36.0)
MCV: 88.9 fL (ref 80.0–100.0)
Platelets: 365 10*3/uL (ref 150–400)
RBC: 4.69 MIL/uL (ref 4.22–5.81)
RDW: 14.1 % (ref 11.5–15.5)
WBC: 20.5 10*3/uL — ABNORMAL HIGH (ref 4.0–10.5)
nRBC: 0 % (ref 0.0–0.2)

## 2019-02-16 LAB — MAGNESIUM: Magnesium: 2.7 mg/dL — ABNORMAL HIGH (ref 1.7–2.4)

## 2019-02-16 LAB — LIPASE, BLOOD: Lipase: 37 U/L (ref 11–51)

## 2019-02-16 MED ORDER — INSULIN ASPART 100 UNIT/ML IV SOLN
10.0000 [IU] | Freq: Once | INTRAVENOUS | Status: AC
  Administered 2019-02-16: 10 [IU] via INTRAVENOUS
  Filled 2019-02-16: qty 0.1

## 2019-02-16 MED ORDER — OXYCODONE HCL 5 MG PO TABS
5.0000 mg | ORAL_TABLET | ORAL | Status: DC | PRN
  Administered 2019-02-19: 5 mg via ORAL
  Filled 2019-02-16 (×2): qty 1

## 2019-02-16 MED ORDER — TAMSULOSIN HCL 0.4 MG PO CAPS
0.4000 mg | ORAL_CAPSULE | Freq: Every day | ORAL | Status: DC
  Administered 2019-02-16 – 2019-02-18 (×3): 0.4 mg via ORAL
  Filled 2019-02-16 (×3): qty 1

## 2019-02-16 MED ORDER — CALCIUM GLUCONATE-NACL 1-0.675 GM/50ML-% IV SOLN
1.0000 g | Freq: Once | INTRAVENOUS | Status: AC
  Administered 2019-02-16: 1000 mg via INTRAVENOUS
  Filled 2019-02-16: qty 50

## 2019-02-16 MED ORDER — FINASTERIDE 5 MG PO TABS
5.0000 mg | ORAL_TABLET | Freq: Every day | ORAL | Status: DC
  Administered 2019-02-17 – 2019-02-19 (×3): 5 mg via ORAL
  Filled 2019-02-16 (×3): qty 1

## 2019-02-16 MED ORDER — ACETAMINOPHEN 325 MG PO TABS: 650.0000 mg | ORAL_TABLET | Freq: Four times a day (QID) | ORAL | Status: DC | PRN

## 2019-02-16 MED ORDER — ASPIRIN EC 81 MG PO TBEC
81.0000 mg | DELAYED_RELEASE_TABLET | Freq: Every day | ORAL | Status: DC
  Administered 2019-02-17 – 2019-02-19 (×3): 81 mg via ORAL
  Filled 2019-02-16 (×3): qty 1

## 2019-02-16 MED ORDER — SODIUM CHLORIDE 0.9 % IV BOLUS: 1000.0000 mL | Freq: Once | INTRAVENOUS | Status: DC

## 2019-02-16 MED ORDER — SODIUM CHLORIDE 0.9 % IV SOLN
1.0000 g | Freq: Once | INTRAVENOUS | Status: AC
  Administered 2019-02-16: 1 g via INTRAVENOUS
  Filled 2019-02-16: qty 10

## 2019-02-16 MED ORDER — ACETAMINOPHEN 650 MG RE SUPP: 650.0000 mg | Freq: Four times a day (QID) | RECTAL | Status: DC | PRN

## 2019-02-16 MED ORDER — MORPHINE SULFATE (PF) 4 MG/ML IV SOLN
4.0000 mg | Freq: Once | INTRAVENOUS | Status: AC
  Administered 2019-02-16: 4 mg via INTRAVENOUS
  Filled 2019-02-16: qty 1

## 2019-02-16 MED ORDER — CHLORHEXIDINE GLUCONATE CLOTH 2 % EX PADS
6.0000 | MEDICATED_PAD | Freq: Every day | CUTANEOUS | Status: DC
  Administered 2019-02-17 – 2019-02-19 (×3): 6 via TOPICAL

## 2019-02-16 MED ORDER — STERILE WATER FOR INJECTION IV SOLN
INTRAVENOUS | Status: DC
  Administered 2019-02-16 – 2019-02-18 (×4): via INTRAVENOUS
  Filled 2019-02-16 (×10): qty 850

## 2019-02-16 MED ORDER — SODIUM CHLORIDE 0.9% FLUSH: 3.0000 mL | Freq: Once | INTRAVENOUS | Status: DC

## 2019-02-16 MED ORDER — DILTIAZEM HCL 30 MG PO TABS
30.0000 mg | ORAL_TABLET | Freq: Four times a day (QID) | ORAL | Status: DC
  Administered 2019-02-16 – 2019-02-17 (×3): 30 mg via ORAL
  Filled 2019-02-16 (×5): qty 1

## 2019-02-16 MED ORDER — ONDANSETRON HCL 4 MG/2ML IJ SOLN: 4.0000 mg | Freq: Four times a day (QID) | INTRAMUSCULAR | Status: DC | PRN

## 2019-02-16 MED ORDER — DEXTROSE 50 % IV SOLN
25.0000 g | Freq: Once | INTRAVENOUS | Status: AC
  Administered 2019-02-16: 25 g via INTRAVENOUS
  Filled 2019-02-16: qty 50

## 2019-02-16 MED ORDER — SODIUM ZIRCONIUM CYCLOSILICATE 10 G PO PACK
10.0000 g | PACK | Freq: Two times a day (BID) | ORAL | Status: DC
  Administered 2019-02-16 (×2): 10 g via ORAL
  Filled 2019-02-16 (×3): qty 1

## 2019-02-16 MED ORDER — ONDANSETRON HCL 4 MG PO TABS: 4.0000 mg | ORAL_TABLET | Freq: Four times a day (QID) | ORAL | Status: DC | PRN

## 2019-02-16 MED ORDER — SODIUM CHLORIDE 0.9 % IV BOLUS
500.0000 mL | Freq: Once | INTRAVENOUS | Status: AC
  Administered 2019-02-16: 500 mL via INTRAVENOUS

## 2019-02-16 MED ORDER — HEPARIN SODIUM (PORCINE) 5000 UNIT/ML IJ SOLN
5000.0000 [IU] | Freq: Three times a day (TID) | INTRAMUSCULAR | Status: DC
  Administered 2019-02-16 – 2019-02-19 (×9): 5000 [IU] via SUBCUTANEOUS
  Filled 2019-02-16 (×9): qty 1

## 2019-02-16 MED ORDER — SODIUM CHLORIDE 0.9 % IV SOLN
INTRAVENOUS | Status: DC
  Administered 2019-02-16: 17:00:00 via INTRAVENOUS

## 2019-02-16 MED ORDER — SODIUM BICARBONATE 8.4 % IV SOLN
50.0000 meq | Freq: Once | INTRAVENOUS | Status: AC
  Administered 2019-02-16: 50 meq via INTRAVENOUS
  Filled 2019-02-16: qty 50

## 2019-02-16 MED ORDER — SODIUM CHLORIDE 0.9 % IV SOLN: 1.0000 g | Freq: Once | INTRAVENOUS | Status: DC

## 2019-02-16 MED ORDER — INSULIN ASPART 100 UNIT/ML ~~LOC~~ SOLN
SUBCUTANEOUS | Status: AC
  Filled 2019-02-16: qty 1

## 2019-02-16 NOTE — ED Triage Notes (Signed)
Pt comes into the ED via EMS from home with c/o lower back pain that started 2 weeks ago, states he fell back when his recliner broke and had to roll out of it , states today he was not able to get out of bed due to abd and back pain. Denies N/V/D.Jim Guerra

## 2019-02-16 NOTE — ED Provider Notes (Signed)
[facility] Emergency Department Provider Note  ____________________________________________   First MD Initiated Contact with Patient 02/16/19 1227     (approximate)  I have reviewed the triage vital signs and the nursing notes.  History  Chief Complaint Abdominal Pain and Back Pain    HPI Jim Wanat. is a 73 y.o. male with hx of CAD, HTN, DM, BPH, lymphedema of BLE who presents for lower back pain and lower abdominal pain.  Patient states his symptoms started about 2 weeks ago, and initially attributed it to a musculoskeletal etiology when he awkwardly had to get out of his chair.  He states he used Aspercreme with some moderate improvement, but then his pain recurred after his recliner fell back while he was sitting in it. He denies any heavy NSAID use. He denies any fevers, nausea, vomiting.  He does report some difficulty with urination and dysuria, but is still making urine.    Past Medical Hx Past Medical History:  Diagnosis Date  . Anxiety   . Arthritis   . Coronary artery disease   . Diabetes mellitus without complication (HCC)   . Elevated lipids   . Environmental allergies   . HOH (hard of hearing)   . Hypertension   . Lymphedema of both lower extremities     Problem List Patient Active Problem List   Diagnosis Date Noted  . Essential hypertension, benign 11/13/2016  . Venous ulcer of ankle (HCC) 06/18/2016  . Cellulitis of lower extremity 06/12/2016  . Lymphedema 06/12/2016  . Swelling of lower extremity 05/22/2016    Past Surgical Hx Past Surgical History:  Procedure Laterality Date  . CATARACT EXTRACTION W/PHACO Right 08/04/2015   Procedure: CATARACT EXTRACTION PHACO AND INTRAOCULAR LENS PLACEMENT (IOC);  Surgeon: Galen Manila, MD;  Location: [facility] ORS;  Service: Ophthalmology;  Laterality: Right;  [place] 00:50 AP% 21.4 CDE 10.79 fluid pack lot # [id number]  . COLONOSCOPY    . FOOT SURGERY    . HERNIA REPAIR       Medications Prior to Admission medications   Medication Sig Start Date End Date Taking? Authorizing Provider  aspirin EC 81 MG tablet Take by mouth.    [provider]  finasteride (PROSCAR) 5 MG tablet Take 5 mg by mouth daily.    [provider]  losartan-hydrochlorothiazide (HYZAAR) 100-25 MG tablet Take 1 tablet by mouth daily.    [provider]  polyethylene glycol (MIRALAX / GLYCOLAX) packet Take by mouth.    [provider]  triamcinolone cream (KENALOG) 0.5 % Apply 1 application topically 3 (three) times daily.    [provider]  valsartan (DIOVAN) 160 MG tablet Take 160 mg by mouth. 02/04/12   [provider]    Allergies Patient has no known allergies.  Family Hx No family history on file.  Social Hx Social History   Tobacco Use  . Smoking status: Never Smoker  . Smokeless tobacco: Never Used  Substance Use Topics  . Alcohol use: No  . Drug use: No     Review of Systems  Constitutional: Negative for fever, chills. Eyes: Negative for visual changes. ENT: Negative for sore throat. Cardiovascular: Negative for chest pain. Respiratory: Negative for shortness of breath. Gastrointestinal: + lower abdominal pain Genitourinary: + for dysuria. Musculoskeletal: + for back pain Skin: Negative for rash. Neurological: Negative for for headaches.   Physical Exam  Vital Signs: ED Triage Vitals  Enc Vitals Group     BP 02/16/19 1111 Marland Kitchen)  126/56     Pulse Rate 02/16/19 1111 77     Resp 02/16/19 1111 18     Temp 02/16/19 1111 97.8 F (36.6 C)     Temp Source 02/16/19 1111 Oral     SpO2 02/16/19 1111 98 %     Weight 02/16/19 1113 198 lb (89.8 kg)     Height 02/16/19 1113 5\' 6"  (1.676 m)     Head Circumference --      Peak Flow --      Pain Score 02/16/19 1113 6     Pain Loc --      Pain Edu? --      Excl. in GC? --     Constitutional: Alert and oriented. Somewhat anxious.  Head: Normocephalic. Atraumatic.  Eyes: Conjunctivae clear. Sclera anicteric. Nose: No congestion. No rhinorrhea. Mouth/Throat: Mucous membranes are slightly dry.  Neck: No stridor.   Cardiovascular: Normal rate, regular rhythm. Extremities well perfused. Respiratory: Normal respiratory effort.  Lungs CTAB. Gastrointestinal: Soft. Abdomen is protuberant. Lower abdominal tenderness. No rebound or guarding.  Musculoskeletal: No deformities. Neurologic:  Normal speech and language. No gross focal neurologic deficits are appreciated.  Psychiatric: Mood and affect are appropriate for situation.  EKG  Personally reviewed.   Rate: 83 Rhythm: sinus Axis: possible LAD Intervals: WNL No acute ischemic changes No peaked T waves No STEMI    Radiology  CXR: IMPRESSION:  1. Moderate left pleural effusion with associated  atelectasis/airspace disease.  2. Mild right basilar atelectasis/airspace disease.   CT: IMPRESSION:  1. Moderate-to-severe left and moderate right hydroureteronephrosis  with marked urinary bladder distention. There is prominent left  greater than right perinephric fluid and stranding suggesting acute  obstruction. Overall, these findings suggest bladder outlet  obstruction secondary to marked prostatomegaly.  2. Small to moderate left-sided pleural effusion.  3. Cholelithiasis.  4. Diverticulosis without evidence of diverticulitis.    Procedures  Procedure(s) performed (including critical care):  .Critical Care Performed by: Miguel AschoffMonks, Sarah L., MD Authorized by: Miguel AschoffMonks, Sarah L., MD   Critical care provider statement:    Critical care time (minutes):  45   Critical care was necessary to treat or prevent imminent or life-threatening deterioration of the following conditions:  Renal failure   Critical care was time spent personally by me on the following activities:  Discussions with consultants, evaluation of patient's response to treatment, examination of patient, ordering and performing  treatments and interventions, ordering and review of laboratory studies, ordering and review of radiographic studies, pulse oximetry, re-evaluation of patient's condition, obtaining history from patient or surrogate and review of old charts     Initial Impression / Assessment and Plan / ED Course  73 y.o. male who presents to the ED for lower abdominal pain and low back pain as noted above.  Work-up reveals new onset, severe, acute renal failure with creatinine 15.9, BUN 205, bicarb 13.  Potassium is 6.5.  No significantly peaked T waves on EKG.  WBC count 20, given an empiric dose of ceftriaxone given complaints of dysuria, but ultimately urinalysis is negative for infection.  Suspect leukocytosis is likely reactive. CT scan as above, will place Foley. With regards to his potassium, given an IV fluid bolus, calcium, discussed with hospitalist for admission.     Final Clinical Impression(s) / ED Diagnosis  Final diagnoses:  Acute renal failure, unspecified acute renal failure type [facility](HCC)       Note:  This document was prepared using Dragon voice recognition software and may  include unintentional dictation errors.   Lilia Pro., MD 02/16/19 [phone]

## 2019-02-16 NOTE — H&P (Signed)
[facility] [place] at [facility]   PATIENT NAME: Jim Guerra    MR#:  [id number]  DATE OF BIRTH:  11/20/1945  DATE OF ADMISSION:  02/16/2019  PRIMARY CARE PHYSICIAN: Marguarite Arbour, MD   REQUESTING/REFERRING PHYSICIAN: Dr Paschal Dopp  CHIEF COMPLAINT:   Chief Complaint  Patient presents with  . Abdominal Pain  . Back Pain    HISTORY OF PRESENT ILLNESS:  Jim Guerra  is a 73 y.o. male patient coming in with 1 week of back pain and abdominal pain.  He has been using some Aspercreme but nothing seems to help.  He has been having some chills on and off.  He cannot void very much with urination.  He has been sleeping in a recliner but last night the recliner flipped over and he had to crawl up the stairs and he put some pillows under his back.  This morning he could not even get up.  His abdominal pain graded 10 out of 10 intensity.  Nothing make it better.  Difficulty with urination.  He is on medications for BPH finasteride.  PAST MEDICAL HISTORY:   Past Medical History:  Diagnosis Date  . Anxiety   . Arthritis   . Coronary artery disease   . Diabetes mellitus without complication (HCC)   . Elevated lipids   . Environmental allergies   . HOH (hard of hearing)   . Hypertension   . Lymphedema of both lower extremities     PAST SURGICAL HISTORY:   Past Surgical History:  Procedure Laterality Date  . CATARACT EXTRACTION W/PHACO Right 08/04/2015   Procedure: CATARACT EXTRACTION PHACO AND INTRAOCULAR LENS PLACEMENT (IOC);  Surgeon: Galen Manila, MD;  Location: [facility] ORS;  Service: Ophthalmology;  Laterality: Right;  [place] 00:50 AP% 21.4 CDE 10.79 fluid pack lot # [id number]  . COLONOSCOPY    . FOOT SURGERY    . HERNIA REPAIR      SOCIAL HISTORY:   Social History   Tobacco Use  . Smoking status: Never Smoker  . Smokeless tobacco: Never Used  Substance Use Topics  . Alcohol use: No    FAMILY HISTORY:   Family History  Problem  Relation Age of Onset  . CAD Mother   . COPD Mother     DRUG ALLERGIES:  No Known Allergies  REVIEW OF SYSTEMS:  CONSTITUTIONAL: No fever, positive for chills.  Weight gain up and down.  Positive for fatigue and weakness.  EYES: No blurred or double vision.  Wears glasses. EARS, NOSE, AND THROAT: No tinnitus or ear pain. No sore throat.  Has sinus issues RESPIRATORY: Some cough, and shortness of breath.  No wheezing or hemoptysis.  CARDIOVASCULAR: No chest pain, orthopnea, edema.  GASTROINTESTINAL: No nausea, vomiting, diarrhea or constipation.  Positive for abdominal pain. No blood in bowel movements GENITOURINARY: Positive for difficulty getting his urine out. ENDOCRINE: No polyuria, nocturia,  HEMATOLOGY: No anemia, easy bruising or bleeding SKIN: No rash or lesion. MUSCULOSKELETAL: No joint pain or arthritis.   NEUROLOGIC: No tingling, numbness, weakness.  PSYCHIATRY: No anxiety or depression.   MEDICATIONS AT HOME:   Prior to Admission medications   Medication Sig Start Date End Date Taking? Authorizing Provider  aspirin EC 81 MG tablet Take 81 mg by mouth daily.    Yes [provider]  finasteride (PROSCAR) 5 MG tablet Take 5 mg by mouth daily.   Yes [provider]  valsartan-hydrochlorothiazide (DIOVAN-HCT) 320-25 MG tablet Take 1 tablet by mouth  daily. 02/26/18 02/26/19 Yes [provider]      VITAL SIGNS:  Blood pressure (!) 126/56, pulse 77, temperature 97.8 F (36.6 C), temperature source Oral, resp. rate 18, height 5\' 6"  (1.676 m), weight 89.8 kg, SpO2 98 %.  PHYSICAL EXAMINATION:  GENERAL:  73 y.o.-year-old patient lying in the bed with no acute distress.  EYES: Pupils equal, round, reactive to light and accommodation. No scleral icterus. Extraocular muscles intact.  HEENT: Head atraumatic, normocephalic. Oropharynx and nasopharynx clear.  NECK:  Supple, no jugular venous distention. No thyroid enlargement, no tenderness.  LUNGS:  Normal breath sounds bilaterally, no wheezing, rales,rhonchi or crepitation. No use of accessory muscles of respiration.  CARDIOVASCULAR: S1, S2 normal. No murmurs, rubs, or gallops.  ABDOMEN: Soft, lower abdominal tenderness, nondistended. Bowel sounds present. No organomegaly or mass.  EXTREMITIES: Trace pedal edema, no cyanosis, or clubbing.  NEUROLOGIC: Cranial nerves II through XII are intact. Muscle strength 5/5 in all extremities. Sensation intact. Gait not checked.  PSYCHIATRIC: The patient is alert and oriented x 3.  SKIN: Chronic lower extremity skin discoloration.  LABORATORY PANEL:   CBC Recent Labs  Lab 02/16/19 1129  WBC 20.5*  HGB 14.2  HCT 41.7  PLT 365   ------------------------------------------------------------------------------------------------------------------  Chemistries  Recent Labs  Lab 02/16/19 1129  NA 132*  K 6.5*  CL 96*  CO2 13*  GLUCOSE 140*  BUN 205*  CREATININE 15.94*  CALCIUM 7.4*  AST 8*  ALT 9  ALKPHOS 54  BILITOT 1.0   ------------------------------------------------------------------------------------------------------------------    RADIOLOGY:  Ct Abdomen Pelvis Wo Contrast  Result Date: 02/16/2019 CLINICAL DATA:  Back pain, abdominal pain EXAM: CT ABDOMEN AND PELVIS WITHOUT CONTRAST TECHNIQUE: Multidetector CT imaging of the abdomen and pelvis was performed following the standard protocol without IV contrast. COMPARISON:  Renal ultrasound 01/07/2009 FINDINGS: Lower chest: Small to moderate left-sided pleural effusion with associated left basilar atelectasis. Right lung bases clear. Hepatobiliary: Liver is within normal limits. Multiple gallstones within the gallbladder lumen. No pericholecystic inflammatory changes. Pancreas: Fatty atrophy of the pancreas without ductal dilatation or surrounding inflammatory change. Spleen: Within normal limits. Adrenals/Urinary Tract: Bilateral adrenal glands unremarkable. Moderate to severe  left and moderate right hydronephrosis. The bilateral ureters are mildly dilated. No renal or ureteral calculi. There is perinephric fluid and stranding, left worse than right. Urinary bladder is markedly distended. Stomach/Bowel: Stomach is within normal limits. Appendix appears normal. Scattered colonic diverticulosis. No evidence of bowel wall thickening, distention, or inflammatory changes. Vascular/Lymphatic: Aortic atherosclerosis. No enlarged abdominal or pelvic lymph nodes. Reproductive: Prostatomegaly. A multilobulated prostate gland results in significant mass effect on the base of the urinary bladder. Prostate measures approximately 5.6 x 5.4 x 6.2 cm. Other: Small fat containing umbilical hernia. Prominent fat contact within the bilateral inguinal canals, right greater than left. No pneumoperitoneum. Musculoskeletal: No acute osseous findings. IMPRESSION: 1. Moderate-to-severe left and moderate right hydroureteronephrosis with marked urinary bladder distention. There is prominent left greater than right perinephric fluid and stranding suggesting acute obstruction. Overall, these findings suggest bladder outlet obstruction secondary to marked prostatomegaly. 2. Small to moderate left-sided pleural effusion. 3. Cholelithiasis. 4. Diverticulosis without evidence of diverticulitis. Electronically Signed   By: Duanne GuessNicholas  Plundo M.D.   On: 02/16/2019 13:35   Dg Chest Port 1 View  Result Date: 02/16/2019 CLINICAL DATA:  Shortness of breath EXAM: PORTABLE CHEST 1 VIEW COMPARISON:  Chest radiograph dated 02/01/2009 FINDINGS: The cardiac silhouette is partially obscured, but does not appear significantly changed. A moderate left  pleural effusion with associated atelectasis/airspace disease is seen. There is mild right basilar atelectasis/airspace disease. There is no right pleural effusion. There is no pneumothorax. Degenerative changes are seen in the shoulders and the spine. IMPRESSION: 1. Moderate left  pleural effusion with associated atelectasis/airspace disease. 2. Mild right basilar atelectasis/airspace disease. Electronically Signed   By: Zerita Boers M.D.   On: 02/16/2019 13:02    EKG:   Sinus rhythm 83 bpm incomplete right bundle branch block  IMPRESSION AND PLAN:   1.  Severe hyperkalemia.  Stat treatment with calcium, sodium bicarb, insulin, D50 and Lokelma.  Gentle IV fluids. 2.  Acute kidney injury secondary to obstruction.  Gentle IV fluids and place Foley.  Nephrology consultation. 3.  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  Start Flomax and continue finasteride.  Urology consultation.  Foley should help. 4.  History of hypertension hold antihypertensive medications at this time 5.  Peripheral vascular disease on aspirin   All the records are reviewed and case discussed with ED provider. Management plans discussed with the patient, family and they are in agreement.  CODE STATUS: Full code  TOTAL TIME TAKING CARE OF THIS PATIENT: 50 minutes, critical care time.    Loletha Grayer M.D on 02/16/2019 at 2:26 PM  Between 7am to 6pm - Pager - [phone]  After 6pm call admission pager [phone]  [facility]  [phone]  CC: Primary care physician; Idelle Crouch, MD

## 2019-02-16 NOTE — ED Notes (Signed)
Patient transported to CT 

## 2019-02-16 NOTE — Consult Note (Signed)
Urology Consult  I have been asked to see the patient by Dr. Earleen Newport, for evaluation and management of urinary retention.  Chief Complaint: Difficulty voiding  History of Present Illness: Jim Oscar. is a 73 y.o. year old male with a personal history of BPH who presents to the emergency room with acute renal failure, urinary retention with bilateral hydroureteronephrosis and perinephric stranding.  A Foley catheter was placed in the emergency room with immediate return of at least 1300 cc of clear yellow urine.  The patient reports today over the past week, he is have been having increasing difficulty voiding.  He is voiding frequent small amounts.  He does have a personal history of BPH previously followed by Dr. Jacqlyn Larsen.  He has a history of remote TURP which he believes was about 20 years ago.  He is currently managed by his primary care physician since Dr. Jacqlyn Larsen relocated, on finasteride only.  No dysuria or gross hematuria.  Most recent PSA 2.04 on 08/2018.  Per Dr. Bjorn Loser notes, he had a prostate biopsy in 2017 which showed chronic inflammation.  Volume was 80 cc.  Past Medical History:  Diagnosis Date  . Anxiety   . Arthritis   . Coronary artery disease   . Diabetes mellitus without complication ([place])   . Elevated lipids   . Environmental allergies   . HOH (hard of hearing)   . Hypertension   . Lymphedema of both lower extremities     Past Surgical History:  Procedure Laterality Date  . CATARACT EXTRACTION W/PHACO Right 08/04/2015   Procedure: CATARACT EXTRACTION PHACO AND INTRAOCULAR LENS PLACEMENT (IOC);  Surgeon: Birder Robson, MD;  Location: [facility] ORS;  Service: Ophthalmology;  Laterality: Right;  [place] 00:50 AP% 21.4 CDE 10.79 fluid pack lot # [id number]  . COLONOSCOPY    . FOOT SURGERY    . HERNIA REPAIR      Home Medications:  Current Meds  Medication Sig  . aspirin EC 81 MG tablet Take 81 mg by mouth daily.   . finasteride (PROSCAR) 5 MG tablet Take 5  mg by mouth daily.  . valsartan-hydrochlorothiazide (DIOVAN-HCT) 320-25 MG tablet Take 1 tablet by mouth daily.    Allergies: No Known Allergies  Family History  Problem Relation Age of Onset  . CAD Mother   . COPD Mother     Social History:  reports that he has never smoked. He has never used smokeless tobacco. He reports that he does not drink alcohol or use drugs.  ROS: A complete review of systems was performed.  All systems are negative except for pertinent findings as noted.  Physical Exam:  Vital signs in last 24 hours: Temp:  [97.7 F (36.5 C)-97.8 F (36.6 C)] 97.7 F (36.5 C) (10/12 1634) Pulse Rate:  [63-95] 81 (10/12 1634) Resp:  [17-32] 20 (10/12 1634) BP: (126-171)/(56-84) 153/77 (10/12 1634) SpO2:  [97 %-100 %] 100 % (10/12 1634) Weight:  [89.8 kg] 89.8 kg (10/12 1113) Constitutional:  Alert and oriented, No acute distress.  Very chatty, sister-in-law at bedside.  Answering most questions appropriately. HEENT: [place] AT, moist mucus membranes.  Trachea midline, no masses Extremity: Bilateral lower extremity erythema Respiratory: Normal work of breathing. GI: Abdomen is soft, nontender, nondistended, no abdominal masses GU: Foley in place draining clear yellow urine Skin: No rashes, bruises or suspicious lesions Neurologic: Grossly intact, no focal deficits, moving all 4 extremities Psychiatric: Normal mood and affect   Laboratory Data:  Recent Labs  02/16/19 1129  WBC 20.5*  HGB 14.2  HCT 41.7   Recent Labs    02/16/19 1129  NA 132*  K 6.5*  CL 96*  CO2 13*  GLUCOSE 140*  BUN 205*  CREATININE 15.94*  CALCIUM 7.4*   No results for input(s): LABPT, INR in the last 72 hours. No results for input(s): LABURIN in the last 72 hours. No results found for this or any previous visit.   Radiologic Imaging: Ct Abdomen Pelvis Wo Contrast  Result Date: 02/16/2019 CLINICAL DATA:  Back pain, abdominal pain EXAM: CT ABDOMEN AND PELVIS WITHOUT CONTRAST  TECHNIQUE: Multidetector CT imaging of the abdomen and pelvis was performed following the standard protocol without IV contrast. COMPARISON:  Renal ultrasound 01/07/2009 FINDINGS: Lower chest: Small to moderate left-sided pleural effusion with associated left basilar atelectasis. Right lung bases clear. Hepatobiliary: Liver is within normal limits. Multiple gallstones within the gallbladder lumen. No pericholecystic inflammatory changes. Pancreas: Fatty atrophy of the pancreas without ductal dilatation or surrounding inflammatory change. Spleen: Within normal limits. Adrenals/Urinary Tract: Bilateral adrenal glands unremarkable. Moderate to severe left and moderate right hydronephrosis. The bilateral ureters are mildly dilated. No renal or ureteral calculi. There is perinephric fluid and stranding, left worse than right. Urinary bladder is markedly distended. Stomach/Bowel: Stomach is within normal limits. Appendix appears normal. Scattered colonic diverticulosis. No evidence of bowel wall thickening, distention, or inflammatory changes. Vascular/Lymphatic: Aortic atherosclerosis. No enlarged abdominal or pelvic lymph nodes. Reproductive: Prostatomegaly. A multilobulated prostate gland results in significant mass effect on the base of the urinary bladder. Prostate measures approximately 5.6 x 5.4 x 6.2 cm. Other: Small fat containing umbilical hernia. Prominent fat contact within the bilateral inguinal canals, right greater than left. No pneumoperitoneum. Musculoskeletal: No acute osseous findings. IMPRESSION: 1. Moderate-to-severe left and moderate right hydroureteronephrosis with marked urinary bladder distention. There is prominent left greater than right perinephric fluid and stranding suggesting acute obstruction. Overall, these findings suggest bladder outlet obstruction secondary to marked prostatomegaly. 2. Small to moderate left-sided pleural effusion. 3. Cholelithiasis. 4. Diverticulosis without evidence  of diverticulitis. Electronically Signed   By: Duanne Guess M.D.   On: 02/16/2019 13:35   Dg Chest Port 1 View  Result Date: 02/16/2019 CLINICAL DATA:  Shortness of breath EXAM: PORTABLE CHEST 1 VIEW COMPARISON:  Chest radiograph dated 02/01/2009 FINDINGS: The cardiac silhouette is partially obscured, but does not appear significantly changed. A moderate left pleural effusion with associated atelectasis/airspace disease is seen. There is mild right basilar atelectasis/airspace disease. There is no right pleural effusion. There is no pneumothorax. Degenerative changes are seen in the shoulders and the spine. IMPRESSION: 1. Moderate left pleural effusion with associated atelectasis/airspace disease. 2. Mild right basilar atelectasis/airspace disease. Electronically Signed   By: Romona Curls M.D.   On: 02/16/2019 13:02     Impression/Plan: 73 year old male with BPH with bladder outlet obstruction now with massive urinary retention with hydroureteronephrosis bilaterally with left periureteral stranding/possible forniceal rupture and acute kidney injury.  1.  BPH with urinary obstruction-continue Foley for at least 7 days to allow for maximal urinary decompression.  Continue finasteride, initiate Flomax.  Will likely require outlet procedure down the road.  2.  Acute kidney injury/renal failure/ hyperkalemia-nephrology consulted.  Should improve with urinary decompression.  Treat hyperkalemia as needed.  Monitor for postobstructive diuresis.    02/16/2019, 4:48 PM  Vanna Scotland,  MD   Thank you for involving me in this patient's care, Please page with any further questions or concerns.

## 2019-02-17 LAB — CBC
HCT: 35.4 % — ABNORMAL LOW (ref 39.0–52.0)
Hemoglobin: 12.2 g/dL — ABNORMAL LOW (ref 13.0–17.0)
MCH: 29.8 pg (ref 26.0–34.0)
MCHC: 34.5 g/dL (ref 30.0–36.0)
MCV: 86.6 fL (ref 80.0–100.0)
Platelets: 270 10*3/uL (ref 150–400)
RBC: 4.09 MIL/uL — ABNORMAL LOW (ref 4.22–5.81)
RDW: 13.9 % (ref 11.5–15.5)
WBC: 13.2 10*3/uL — ABNORMAL HIGH (ref 4.0–10.5)
nRBC: 0 % (ref 0.0–0.2)

## 2019-02-17 LAB — BASIC METABOLIC PANEL
Anion gap: 14 (ref 5–15)
BUN: 148 mg/dL — ABNORMAL HIGH (ref 8–23)
CO2: 18 mmol/L — ABNORMAL LOW (ref 22–32)
Calcium: 7 mg/dL — ABNORMAL LOW (ref 8.9–10.3)
Chloride: 103 mmol/L (ref 98–111)
Creatinine, Ser: 5.59 mg/dL — ABNORMAL HIGH (ref 0.61–1.24)
GFR calc Af Amer: 11 mL/min — ABNORMAL LOW (ref >60)
GFR calc non Af Amer: 9 mL/min — ABNORMAL LOW (ref >60)
Glucose, Bld: 118 mg/dL — ABNORMAL HIGH (ref 70–99)
Potassium: 3.4 mmol/L — ABNORMAL LOW (ref 3.5–5.1)
Sodium: 135 mmol/L (ref 135–145)

## 2019-02-17 LAB — URINE CULTURE

## 2019-02-17 LAB — GLUCOSE, CAPILLARY
Glucose-Capillary: 191 mg/dL — ABNORMAL HIGH (ref 70–99)
Glucose-Capillary: 140 mg/dL — ABNORMAL HIGH (ref 70–99)

## 2019-02-17 LAB — SARS CORONAVIRUS 2 (TAT 6-24 HRS): SARS Coronavirus 2: NEGATIVE

## 2019-02-17 MED ORDER — POTASSIUM CHLORIDE 10 MEQ/100ML IV SOLN
10.0000 meq | INTRAVENOUS | Status: AC
  Administered 2019-02-17 (×4): 10 meq via INTRAVENOUS
  Filled 2019-02-17 (×3): qty 100

## 2019-02-17 MED ORDER — SODIUM CHLORIDE 0.9 % IV SOLN
INTRAVENOUS | Status: DC | PRN
  Administered 2019-02-17 (×2): 30 mL via INTRAVENOUS

## 2019-02-17 MED ORDER — POTASSIUM CHLORIDE CRYS ER 20 MEQ PO TBCR
20.0000 meq | EXTENDED_RELEASE_TABLET | Freq: Once | ORAL | Status: AC
  Administered 2019-02-17: 20 meq via ORAL
  Filled 2019-02-17: qty 1

## 2019-02-17 MED ORDER — CALCIUM GLUCONATE-NACL 1-0.675 GM/50ML-% IV SOLN
1.0000 g | Freq: Once | INTRAVENOUS | Status: AC
  Administered 2019-02-17: 1000 mg via INTRAVENOUS
  Filled 2019-02-17: qty 50

## 2019-02-17 MED ORDER — INSULIN ASPART 100 UNIT/ML ~~LOC~~ SOLN
0.0000 [IU] | Freq: Three times a day (TID) | SUBCUTANEOUS | Status: DC
  Administered 2019-02-17: 2 [IU] via SUBCUTANEOUS
  Administered 2019-02-18 (×2): 1 [IU] via SUBCUTANEOUS
  Administered 2019-02-18: 2 [IU] via SUBCUTANEOUS
  Administered 2019-02-19 (×2): 1 [IU] via SUBCUTANEOUS
  Filled 2019-02-17 (×6): qty 1

## 2019-02-17 MED ORDER — INSULIN ASPART 100 UNIT/ML ~~LOC~~ SOLN: 0.0000 [IU] | Freq: Every day | SUBCUTANEOUS | Status: DC

## 2019-02-17 NOTE — Progress Notes (Signed)
Urology Inpatient Progress Note  Subjective: Jim Guerra. is a 73 y.o. male who presented to the emergency department yesterday with acute renal failure, urinary retention with bilateral hydroureteronephrosis, and perinephric stranding.  He is s/p Foley placement with bladder decompression of 1300 mL urine.  He subsequently produced 3800 mL urine per Foley catheter.  Creatinine 5.59 today, down from 15.94 upon presentation.  Potassium 3.4 this morning, down from 6.5 upon presentation.  UA un-concerning for infection, urine culture pending.  Patient given a single dose of ceftriaxone upon presentation, subsequently discontinued.  Foley in place today draining clear yellow urine.  Anti-infectives: Anti-infectives (From admission, onward)   Start     Dose/Rate Route Frequency Ordered Stop   02/16/19 1315  cefTRIAXone (ROCEPHIN) 1 g in sodium chloride 0.9 % 100 mL IVPB     1 g 200 mL/hr over 30 Minutes Intravenous  Once 02/16/19 1303 02/16/19 1415      Current Facility-Administered Medications  Medication Dose Route Frequency Provider Last Rate Last Dose  . acetaminophen (TYLENOL) tablet 650 mg  650 mg Oral Q6H PRN Alford Highland, MD       Or  . acetaminophen (TYLENOL) suppository 650 mg  650 mg Rectal Q6H PRN Alford Highland, MD      . aspirin EC tablet 81 mg  81 mg Oral Daily Alford Highland, MD   81 mg at 02/17/19 0809  . Chlorhexidine Gluconate Cloth 2 % PADS 6 each  6 each Topical Daily Enid Baas, Jude, MD   6 each at 02/17/19 0809  . diltiazem (CARDIZEM) tablet 30 mg  30 mg Oral Q6H Alford Highland, MD   30 mg at 02/17/19 0556  . finasteride (PROSCAR) tablet 5 mg  5 mg Oral Daily Alford Highland, MD   5 mg at 02/17/19 0809  . heparin injection 5,000 Units  5,000 Units Subcutaneous Q8H Alford Highland, MD   5,000 Units at 02/17/19 0556  . ondansetron (ZOFRAN) tablet 4 mg  4 mg Oral Q6H PRN Alford Highland, MD       Or  . ondansetron [facility]) injection 4 mg  4 mg  Intravenous Q6H PRN Wieting, Richard, MD      . oxyCODONE (Oxy IR/ROXICODONE) immediate release tablet 5 mg  5 mg Oral Q4H PRN Wieting, Richard, MD      . sodium bicarbonate 150 mEq in sterile water 1,000 mL infusion   Intravenous Continuous Alford Highland, MD 50 mL/hr at 02/16/19 2214    . sodium chloride flush (NS) 0.9 % injection 3 mL  3 mL Intravenous Once Alford Highland, MD      . tamsulosin [facility]) capsule 0.4 mg  0.4 mg Oral QPC supper Alford Highland, MD   0.4 mg at 02/16/19 1652     Objective: Vital signs in last 24 hours: Temp:  [97.4 F (36.3 C)-97.8 F (36.6 C)] 97.5 F (36.4 C) (10/13 0447) Pulse Rate:  [63-109] 75 (10/13 0447) Resp:  [17-32] 20 (10/13 0447) BP: (106-171)/(50-84) 106/50 (10/13 0447) SpO2:  [93 %-100 %] 93 % (10/13 0447) Weight:  [89.8 kg] 89.8 kg (10/12 1113)  Intake/Output from previous day: 10/12 0701 - 10/13 0700 In: 1600.9 [P.O.:240; I.V.:717.7; IV Piggyback:643.2] Out: 5100 [Urine:5100] Intake/Output this shift: No intake/output data recorded.  Physical Exam Vitals signs and nursing note reviewed.  Constitutional:      General: He is awake. He is not in acute distress.    Appearance: He is not ill-appearing, toxic-appearing or diaphoretic.  HENT:  Head: Normocephalic and atraumatic.  Pulmonary:     Effort: Pulmonary effort is normal. No respiratory distress.  Feet:     Right foot:     Skin integrity: Erythema present.     Toenail Condition: Right toenails are abnormally thick and long.     Left foot:     Skin integrity: Erythema present.     Toenail Condition: Left toenails are abnormally thick and long.  Skin:    General: Skin is warm and dry.  Psychiatric:        Behavior: Behavior is cooperative.    Lab Results:  Recent Labs    02/16/19 1129 02/17/19 0514  WBC 20.5* 13.2*  HGB 14.2 12.2*  HCT 41.7 35.4*  PLT 365 270   BMET Recent Labs    02/16/19 1740 02/17/19 0514  NA 134* 135  K 4.7 3.4*  CL 100 103   CO2 13* 18*  GLUCOSE 74 118*  BUN 181* 148*  CREATININE 12.57* 5.59*  CALCIUM 7.6* 7.0*   Assessment & Plan: Patient clinically improving following bladder decompression in the setting of BPH with urinary obstruction, CT significant for prostatomegaly.  Patient started on Flomax, creatinine down significantly.  Recommend continued Foley with plans for outpatient voiding trial in 1 to 2 weeks.  No new recommendations today.  Debroah Loop, PA-C 02/17/2019

## 2019-02-17 NOTE — Evaluation (Signed)
Physical Therapy Evaluation Patient Details Name: Jim FiguresMorris E Brier Jr. MRN: [id number] DOB: October 31, 1945 Today's Date: 02/17/2019   History of Present Illness  Pt is a 73 y/o M presented to ED on 02/16/2019 with a 1 week history of back pain and severe abdominal pain, chills and difficulty voiding. 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 Foley placed for urinary decompression. CT imaging shows prostatomegaly.  Clinical Impression  Pt is a pleasant 73 year old M who was admitted for acute kidney injury secondary to urinary obstruction. Pt performs bed mobility, transfers, and ambulation with min/mod A. Pt is generally anxious with mobility, at times requiring encouragement and repeated cuing for safety and sequencing. Pt limited in treatment session today secondary to reports of fatigue and pain in LEs. Pt requiring mod A for trunk management and verbal and tactile cuing for performance of supine>sit utilizing log roll technique. Pt performs sit>stand transfer with min A for balance and safety, reporting R knee pain and fatigue upon standing. Pt ambulates with min A 5 feet to transfer bed>chair with RW and cuing for safe sequencing and RW management. Pt demonstrates deficits with strength, range of motion, balance and pain limiting ability to perform functional mobility. Would benefit from skilled PT to address above deficits and promote optimal return to PLOF.      Follow Up Recommendations SNF    Equipment Recommendations  Rolling walker with 5" wheels    Recommendations for Other Services       Precautions / Restrictions Precautions Precautions: Fall Restrictions Weight Bearing Restrictions: No      Mobility  Bed Mobility Overal bed mobility: Needs Assistance Bed Mobility: Supine to Sit     Supine to sit: Mod assist     General bed mobility  comments: Mod A for trunk management; requiring cuing for log roll technique and sequencing, demonstrating anxiety with mobility limiting at times pt's ability to follow commands  Transfers Overall transfer level: Needs assistance Equipment used: Rolling walker (2 wheeled) Transfers: Sit to/from Stand Sit to Stand: Min assist         General transfer comment: Min A for safety and balance; VCs for safe hand placement with RW  Ambulation/Gait Ambulation/Gait assistance: Min assist Gait Distance (Feet): 5 Feet Assistive device: Rolling walker (2 wheeled) Gait Pattern/deviations: Decreased step length - right;Decreased step length - left;Trunk flexed;Narrow base of support     General Gait Details: Performed to ambulate from bed to chair. Pt anxious with mobility and reporting fatigue and pain in LEs, limiting gait distance. VCs for RW management and safe transfer from stand>sit.  Stairs            Wheelchair Mobility    Modified Rankin (Stroke Patients Only)       Balance Overall balance assessment: Needs assistance Sitting-balance support: No upper extremity supported;Feet unsupported Sitting balance-Leahy Scale: Good Sitting balance - Comments: Able to scoot to edge of bed without UE/LE support.   Standing balance support: Bilateral upper extremity supported Standing balance-Leahy Scale: Poor Standing balance comment: Pt is anxious with mobility; able to stand with support of RW                             Pertinent Vitals/Pain Pain Assessment: 0-10 Pain Score: 4  Pain Location: R knee and bilateral legs - also complains of itching of LEs Pain Descriptors /  Indicators: Aching;Tender Pain Intervention(s): Limited activity within patient's tolerance;Repositioned;Monitored during session    Home Living Family/patient expects to be discharged to:: Private residence(sister-in-law present expresses concern with pt returning home alone) Living Arrangements:  Alone Available Help at Discharge: Family;Available PRN/intermittently Type of Home: House Home Access: Stairs to enter Entrance Stairs-Rails: None Entrance Stairs-Number of Steps: 3 Home Layout: Two level;Bed/bath upstairs Home Equipment: None      Prior Function Level of Independence: Independent         Comments: Pt was previously independent with no AD performing household and community ambulation and light yard work. Sister in law is present to help provide subjective history, reporting family is inconsistently available and expressing concern with pt returning home alone.     Hand Dominance        Extremity/Trunk Assessment   Upper Extremity Assessment Upper Extremity Assessment: Overall WFL for tasks assessed    Lower Extremity Assessment Lower Extremity Assessment: Overall WFL for tasks assessed;RLE deficits/detail;LLE deficits/detail RLE Deficits / Details: reporting pain with R knee flexion AROM RLE Sensation: WNL LLE Sensation: WNL       Communication   Communication: HOH  Cognition Arousal/Alertness: Awake/alert Behavior During Therapy: Anxious Overall Cognitive Status: Within Functional Limits for tasks assessed                                        General Comments      Exercises Total Joint Exercises Ankle Circles/Pumps: AROM;Both;20 reps   Assessment/Plan    PT Assessment Patient needs continued PT services  PT Problem List Decreased strength;Decreased mobility;Decreased safety awareness;Decreased range of motion;Decreased activity tolerance;Decreased balance;Pain       PT Treatment Interventions DME instruction;Therapeutic exercise;Gait training;Balance training;Stair training;Neuromuscular re-education;Functional mobility training;Therapeutic activities;Patient/family education    PT Goals (Current goals can be found in the Care Plan section)  Acute Rehab PT Goals Patient Stated Goal: to have less pain PT Goal  Formulation: With patient Time For Goal Achievement: 03/03/19 Potential to Achieve Goals: Good    Frequency Min 2X/week   Barriers to discharge        Co-evaluation               AM-PAC PT "6 Clicks" Mobility  Outcome Measure Help needed turning from your back to your side while in a flat bed without using bedrails?: A Lot Help needed moving from lying on your back to sitting on the side of a flat bed without using bedrails?: A Lot Help needed moving to and from a bed to a chair (including a wheelchair)?: A Little Help needed standing up from a chair using your arms (e.g., wheelchair or bedside chair)?: A Little Help needed to walk in hospital room?: A Little Help needed climbing 3-5 steps with a railing? : A Lot 6 Click Score: 15    End of Session Equipment Utilized During Treatment: Gait belt Activity Tolerance: Patient limited by fatigue;Patient limited by pain Patient left: in chair;with call bell/phone within reach;with chair alarm set;with family/visitor present   PT Visit Diagnosis: Unsteadiness on feet (R26.81);Muscle weakness (generalized) (M62.81);History of falling (Z91.81);Difficulty in walking, not elsewhere classified (R26.2);Pain Pain - Right/Left: Right(Both) Pain - part of body: Knee;Leg(bilateral LEs tender to touch, reports pain in R knee)    Time: 8341-9622 PT Time Calculation (min) (ACUTE ONLY): 36 min   Charges:   PT Evaluation $PT Eval Moderate Complexity: 1 Mod PT  Treatments $Gait Training: 8-22 mins        Petra Kuba, PT, DPT 02/17/19, 4:41 PM

## 2019-02-17 NOTE — Progress Notes (Signed)
[facility] [place] NAME: Jim Guerra    MR#:  [id number]  DATE OF BIRTH:  March 09, 1946  SUBJECTIVE:  CHIEF COMPLAINT:   Chief Complaint  Patient presents with  . Abdominal Pain  . Back Pain   No new complaint this morning.  No fevers.  Seen by urologist and nephrologist already.  Noted significant improvement in renal function overnight.  REVIEW OF SYSTEMS:  ROS CONSTITUTIONAL: No fever, Positive for fatigue and weakness.  EYES: No blurred or double vision.  Wears glasses. EARS, NOSE, AND THROAT: No tinnitus or ear pain. No sore throat.  Has sinus issues RESPIRATORY: Some cough, and shortness of breath.  No wheezing or hemoptysis.  CARDIOVASCULAR: No chest pain, orthopnea, edema.  GASTROINTESTINAL: No nausea, vomiting, diarrhea or constipation.  No abdominal pain. No blood in bowel movements GENITOURINARY: Positive for difficulty getting his urine out. ENDOCRINE: No polyuria, nocturia,  HEMATOLOGY: No anemia, easy bruising or bleeding SKIN: No rash or lesion. MUSCULOSKELETAL: No joint pain or arthritis.   NEUROLOGIC: No tingling, numbness, weakness.  PSYCHIATRY: No anxiety or depression.   DRUG ALLERGIES:  No Known Allergies VITALS:  Blood pressure (!) 120/51, pulse 71, temperature 98.3 F (36.8 C), temperature source Oral, resp. rate 18, height 5\' 6"  (1.676 m), weight 89.8 kg, SpO2 96 %. PHYSICAL EXAMINATION:  Physical Exam   GENERAL:  73 y.o.-year-old patient lying in the bed with no acute distress.  EYES: Pupils equal, round, reactive to light and accommodation. No scleral icterus. Extraocular muscles intact.  HEENT: Head atraumatic, normocephalic. Oropharynx and nasopharynx clear.  NECK:  Supple, no jugular venous distention. No thyroid enlargement, no tenderness.  LUNGS: Normal breath sounds bilaterally, no wheezing, rales,rhonchi or crepitation. No use of accessory muscles of respiration.  CARDIOVASCULAR: S1, S2  normal. No murmurs, rubs, or gallops.  ABDOMEN: Soft, nontender, nondistended. Bowel sounds present. No organomegaly or mass.  EXTREMITIES: Trace pedal edema, no cyanosis, or clubbing.  NEUROLOGIC: Generalized weakness .Sensation intact. Gait not checked.  PSYCHIATRIC: The patient is alert and oriented x 3.  SKIN: Chronic lower extremity skin discoloration. LABORATORY PANEL:  Male CBC Recent Labs  Lab 02/17/19 0514  WBC 13.2*  HGB 12.2*  HCT 35.4*  PLT 270   ------------------------------------------------------------------------------------------------------------------ Chemistries  Recent Labs  Lab 02/16/19 1129 02/16/19 1740 02/17/19 0514  NA 132* 134* 135  K 6.5* 4.7 3.4*  CL 96* 100 103  CO2 13* 13* 18*  GLUCOSE 140* 74 118*  BUN 205* 181* 148*  CREATININE 15.94* 12.57* 5.59*  CALCIUM 7.4* 7.6* 7.0*  MG  --  2.7*  --   AST 8*  --   --   ALT 9  --   --   ALKPHOS 54  --   --   BILITOT 1.0  --   --    RADIOLOGY:  No results found. ASSESSMENT AND PLAN:   1.  Severe hyperkalemia.   Treated with calcium, sodium bicarb, insulin, D50 and Lokelma and resolved. . 2.  Acute kidney injury secondary to obstruction.  Patient with bilateral hydronephrosis on CT scan. Renal function significantly improved with IV fluids following placement of Foley catheter on admission. Nephrology following.  Continue bicarb drip.  Metabolic acidosis improving.  3.  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  Foley catheter placed with improvement in renal function. Started Flomax and continue finasteride.  Patient seen by urologist.  Recommended continuing Foley catheter with plans for outpatient voiding trial in  1 to 2 weeks.  No further intervention recommended at this time  4.  History of hypertension Blood pressure fairly controlled.  Monitor and adjust meds as needed  5.  Peripheral vascular disease on aspirin  6.  Newly diagnosed diabetes mellitus Noted  glycosylated hemoglobin level of 7.0.  Blood sugars currently controlled. Placed on sliding scale insulin coverage.  DVT prophylaxis; heparin  All the records are reviewed and case discussed with Care Management/Social Worker. Management plans discussed with the patient, family and they are in agreement. Updated patient's family over the phone today.  All questions were answered.  CODE STATUS: Full Code  TOTAL TIME TAKING CARE OF THIS PATIENT: 37 minutes.   More than 50% of the time was spent in counseling/coordination of care: YES  POSSIBLE D/C IN 2 DAYS, DEPENDING ON CLINICAL CONDITION.   Casi Westerfeld M.D on 02/17/2019 at 2:40 PM  Between 7am to 6pm - Pager - [phone]  After 6pm go to [email or web address] - Social research officer, government  [facility]  Office  [phone]  CC: Primary care physician; Marguarite Arbour, MD  Note: This dictation was prepared with Dragon dictation along with smaller phrase technology. Any transcriptional errors that result from this process are unintentional.

## 2019-02-17 NOTE — Consult Note (Signed)
[facility] CONSULT NOTE    Date: 02/17/2019                  Patient Name:  Jim Guerra.  MRN: [id number]  DOB: 08-Nov-1945  Age / Sex: 73 y.o., male         PCP: Marguarite Arbour, MD                 Service Requesting Consult:  Hospitalist                 Reason for Consult:  Acute kidney injury secondary to hydronephrosis and urinary retention            History of Present Illness: Patient is a 73 y.o. male with a PMHx of anxiety, coronary artery disease, diabetes mellitus type 2, environmental allergies, hypertension, lymphedema, who was admitted to [facility] on 02/16/2019 for evaluation of back and abdominal pain for 1 week prior to admission.  He was difficult to arouse during our interview therefore unable to offer any history or review of systems.  Yesterday he was found to have severe acute renal failure with bilateral hydronephrosis.  Foley catheter was subsequently placed and he had 5.1 L of urine output over the preceding 24 hours.  His creatinine has started to trend down as well.  Urology has also been consulted on the case and they are monitoring the patient's progress.   Medications: Outpatient medications: Medications Prior to Admission  Medication Sig Dispense Refill Last Dose  . aspirin EC 81 MG tablet Take 81 mg by mouth daily.    02/15/2019 at Unknown time  . finasteride (PROSCAR) 5 MG tablet Take 5 mg by mouth daily.   02/15/2019 at Unknown time  . valsartan-hydrochlorothiazide (DIOVAN-HCT) 320-25 MG tablet Take 1 tablet by mouth daily.   02/15/2019 at Unknown time    Current medications: Current Facility-Administered Medications  Medication Dose Route Frequency Provider Last Rate Last Dose  . acetaminophen (TYLENOL) tablet 650 mg  650 mg Oral Q6H PRN Alford Highland, MD       Or  . acetaminophen (TYLENOL) suppository 650 mg  650 mg Rectal Q6H PRN Alford Highland, MD      . aspirin EC tablet 81 mg  81 mg Oral Daily Alford Highland, MD   81 mg at 02/17/19 0809  . calcium gluconate 1 g/ 50 mL sodium chloride IVPB  1 g Intravenous Once Ojie, Jude, MD      . Chlorhexidine Gluconate Cloth 2 % PADS 6 each  6 each Topical Daily Enid Baas, Jude, MD   6 each at 02/17/19 0809  . diltiazem (CARDIZEM) tablet 30 mg  30 mg Oral Q6H Alford Highland, MD   30 mg at 02/17/19 0556  . finasteride (PROSCAR) tablet 5 mg  5 mg Oral Daily Alford Highland, MD   5 mg at 02/17/19 0809  . heparin injection 5,000 Units  5,000 Units Subcutaneous Q8H Alford Highland, MD   5,000 Units at 02/17/19 0556  . ondansetron (ZOFRAN) tablet 4 mg  4 mg Oral Q6H PRN Alford Highland, MD       Or  . ondansetron (ZOFRAN) injection 4 mg  4 mg Intravenous Q6H PRN Wieting, Richard, MD      . oxyCODONE (Oxy IR/ROXICODONE) immediate release tablet 5 mg  5 mg Oral Q4H PRN Wieting, Richard, MD      . sodium bicarbonate 150 mEq in sterile water 1,000 mL infusion   Intravenous Continuous Ojie,  Jude, MD 75 mL/hr at 02/17/19 1210    . sodium chloride flush (NS) 0.9 % injection 3 mL  3 mL Intravenous Once Wieting, Richard, MD      . tamsulosin [facility]) capsule 0.4 mg  0.4 mg Oral QPC supper Alford Highland, MD   0.4 mg at 02/16/19 1652      Allergies: No Known Allergies    Past Medical History: Past Medical History:  Diagnosis Date  . Anxiety   . Arthritis   . Coronary artery disease   . Diabetes mellitus without complication (HCC)   . Elevated lipids   . Environmental allergies   . HOH (hard of hearing)   . Hypertension   . Lymphedema of both lower extremities      Past Surgical History: Past Surgical History:  Procedure Laterality Date  . CATARACT EXTRACTION W/PHACO Right 08/04/2015   Procedure: CATARACT EXTRACTION PHACO AND INTRAOCULAR LENS PLACEMENT (IOC);  Surgeon: Galen Manila, MD;  Location: [facility] ORS;  Service: Ophthalmology;  Laterality: Right;  [place] 00:50 AP% 21.4 CDE 10.79 fluid pack lot # [id number]  . COLONOSCOPY    . FOOT SURGERY    .  HERNIA REPAIR       Family History: Family History  Problem Relation Age of Onset  . CAD Mother   . COPD Mother      Social History: Social History   Socioeconomic History  . Marital status: Married    Spouse name: Not on file  . Number of children: Not on file  . Years of education: Not on file  . Highest education level: Not on file  Occupational History  . Not on file  Social Needs  . Financial resource strain: Not on file  . Food insecurity    Worry: Not on file    Inability: Not on file  . Transportation needs    Medical: Not on file    Non-medical: Not on file  Tobacco Use  . Smoking status: Never Smoker  . Smokeless tobacco: Never Used  Substance and Sexual Activity  . Alcohol use: No  . Drug use: No  . Sexual activity: Not on file  Lifestyle  . Physical activity    Days per week: Not on file    Minutes per session: Not on file  . Stress: Not on file  Relationships  . Social Musician on phone: Not on file    Gets together: Not on file    Attends religious service: Not on file    Active member of club or organization: Not on file    Attends meetings of clubs or organizations: Not on file    Relationship status: Not on file  . Intimate partner violence    Fear of current or ex partner: Not on file    Emotionally abused: Not on file    Physically abused: Not on file    Forced sexual activity: Not on file  Other Topics Concern  . Not on file  Social History Narrative  . Not on file     Review of Systems: Patient unable to offer secondary to lethargy.  Vital Signs: Blood pressure (!) 120/51, pulse 71, temperature 98.3 F (36.8 C), temperature source Oral, resp. rate 18, height  (1.676 m), weight 89.8 kg, SpO2 96 %.  Weight trends: Filed Weights   02/16/19 1113  Weight: 89.8 kg    Physical Exam: General: NAD, resting in bed  Head: Normocephalic, atraumatic.  Eyes: Anicteric, EOMI  Nose: Mucous membranes moist, not  inflammed, nonerythematous.  Throat: Oropharynx nonerythematous, no exudate appreciated.   Neck: Supple, trachea midline.  Lungs:  Normal respiratory effort. Clear to auscultation BL without crackles or wheezes.  Heart: RRR. S1 and S2 normal without gallop, murmur, or rubs.  Abdomen:  BS normoactive. Soft, Nondistended, non-tender.  No masses or organomegaly.  Extremities: No pretibial edema.  Neurologic: Lethargic but arousable.  Skin: No visible rashes, scars.    Lab results: Basic Metabolic Panel: Recent Labs  Lab 02/16/19 1129 02/16/19 1740 02/17/19 0514  NA 132* 134* 135  K 6.5* 4.7 3.4*  CL 96* 100 103  CO2 13* 13* 18*  GLUCOSE 140* 74 118*  BUN 205* 181* 148*  CREATININE 15.94* 12.57* 5.59*  CALCIUM 7.4* 7.6* 7.0*  MG  --  2.7*  --     Liver Function Tests: Recent Labs  Lab 02/16/19 1129  AST 8*  ALT 9  ALKPHOS 54  BILITOT 1.0  PROT 7.4  ALBUMIN 3.2*   Recent Labs  Lab 02/16/19 1129  LIPASE 37   No results for input(s): AMMONIA in the last 168 hours.  CBC: Recent Labs  Lab 02/16/19 1129 02/17/19 0514  WBC 20.5* 13.2*  HGB 14.2 12.2*  HCT 41.7 35.4*  MCV 88.9 86.6  PLT 365 270    Cardiac Enzymes: No results for input(s): CKTOTAL, CKMB, CKMBINDEX, TROPONINI in the last 168 hours.  BNP: Invalid input(s): POCBNP  CBG: No results for input(s): GLUCAP in the last 168 hours.  Microbiology: Results for orders placed or performed during the hospital encounter of 02/16/19  Urine culture     Status: Abnormal   Collection Time: 02/16/19  1:03 PM   Specimen: Urine, Random  Result Value Ref Range Status   Specimen Description   Final    URINE, RANDOM Performed at [facility], [address] [place], [place] [address]    Special Requests   Final    NONE Performed at [facility], [address] [place], [place] [address]    Culture MULTIPLE SPECIES PRESENT, SUGGEST RECOLLECTION (A)  Final   Report Status 02/17/2019  FINAL  Final  SARS CORONAVIRUS 2 (TAT 6-24 HRS) Nasopharyngeal Nasopharyngeal Swab     Status: None   Collection Time: 02/16/19  1:54 PM   Specimen: Nasopharyngeal Swab  Result Value Ref Range Status   SARS Coronavirus 2 NEGATIVE NEGATIVE Final    Comment: (NOTE) SARS-CoV-2 target nucleic acids are NOT DETECTED. The SARS-CoV-2 RNA is generally detectable in upper and lower respiratory specimens during the acute phase of infection. Negative results do not preclude SARS-CoV-2 infection, do not rule out co-infections with other pathogens, and should not be used as the sole basis for treatment or other patient management decisions. Negative results must be combined with clinical observations, patient history, and epidemiological information. The expected result is Negative. Fact Sheet for Patients: HairSlick.nohttps://www.fda.gov/media/138098/download Fact Sheet for Healthcare Providers: quierodirigir.comhttps://www.fda.gov/media/138095/download This test is not yet approved or cleared by the [place] FDA and  has been authorized for detection and/or diagnosis of SARS-CoV-2 by FDA under an Emergency Use Authorization (EUA). This EUA will remain  in effect (meaning this test can be used) for the duration of the COVID-19 declaration under Section 56 4(b)(1) of the Act, 21 U.S.C. section 360bbb-3(b)(1), unless the authorization is terminated or revoked sooner. Performed at [facility], [address] [place], [place] [address]     Coagulation Studies: No results for input(s): LABPROT,  INR in the last 72 hours.  Urinalysis: Recent Labs    02/16/19 1129  COLORURINE YELLOW*  LABSPEC 1.012  PHURINE 5.0  GLUCOSEU NEGATIVE  HGBUR NEGATIVE  BILIRUBINUR NEGATIVE  KETONESUR NEGATIVE  PROTEINUR NEGATIVE  NITRITE NEGATIVE  LEUKOCYTESUR NEGATIVE      Imaging: Ct Abdomen Pelvis Wo Contrast  Result Date: 02/16/2019 CLINICAL DATA:  Back pain, abdominal pain EXAM: CT ABDOMEN AND PELVIS WITHOUT  CONTRAST TECHNIQUE: Multidetector CT imaging of the abdomen and pelvis was performed following the standard protocol without IV contrast. COMPARISON:  Renal ultrasound 01/07/2009 FINDINGS: Lower chest: Small to moderate left-sided pleural effusion with associated left basilar atelectasis. Right lung bases clear. Hepatobiliary: Liver is within normal limits. Multiple gallstones within the gallbladder lumen. No pericholecystic inflammatory changes. Pancreas: Fatty atrophy of the pancreas without ductal dilatation or surrounding inflammatory change. Spleen: Within normal limits. Adrenals/Urinary Tract: Bilateral adrenal glands unremarkable. Moderate to severe left and moderate right hydronephrosis. The bilateral ureters are mildly dilated. No renal or ureteral calculi. There is perinephric fluid and stranding, left worse than right. Urinary bladder is markedly distended. Stomach/Bowel: Stomach is within normal limits. Appendix appears normal. Scattered colonic diverticulosis. No evidence of bowel wall thickening, distention, or inflammatory changes. Vascular/Lymphatic: Aortic atherosclerosis. No enlarged abdominal or pelvic lymph nodes. Reproductive: Prostatomegaly. A multilobulated prostate gland results in significant mass effect on the base of the urinary bladder. Prostate measures approximately 5.6 x 5.4 x 6.2 cm. Other: Small fat containing umbilical hernia. Prominent fat contact within the bilateral inguinal canals, right greater than left. No pneumoperitoneum. Musculoskeletal: No acute osseous findings. IMPRESSION: 1. Moderate-to-severe left and moderate right hydroureteronephrosis with marked urinary bladder distention. There is prominent left greater than right perinephric fluid and stranding suggesting acute obstruction. Overall, these findings suggest bladder outlet obstruction secondary to marked prostatomegaly. 2. Small to moderate left-sided pleural effusion. 3. Cholelithiasis. 4. Diverticulosis without  evidence of diverticulitis. Electronically Signed   By: Duanne Guess M.D.   On: 02/16/2019 13:35   Dg Chest Port 1 View  Result Date: 02/16/2019 CLINICAL DATA:  Shortness of breath EXAM: PORTABLE CHEST 1 VIEW COMPARISON:  Chest radiograph dated 02/01/2009 FINDINGS: The cardiac silhouette is partially obscured, but does not appear significantly changed. A moderate left pleural effusion with associated atelectasis/airspace disease is seen. There is mild right basilar atelectasis/airspace disease. There is no right pleural effusion. There is no pneumothorax. Degenerative changes are seen in the shoulders and the spine. IMPRESSION: 1. Moderate left pleural effusion with associated atelectasis/airspace disease. 2. Mild right basilar atelectasis/airspace disease. Electronically Signed   By: Romona Curls M.D.   On: 02/16/2019 13:02      Assessment & Plan: Pt is a 73 y.o. male with a PMHx of anxiety, coronary artery disease, diabetes mellitus type 2, environmental allergies, hypertension, lymphedema, who was admitted to [facility] on 02/16/2019 for evaluation of back and abdominal pain for 1 week prior to admission.   1.  Acute kidney injury secondary to urinary obstruction.  Patient found to have prostatomegaly on CT scan of the abdomen pelvis.  There is bilateral hydronephrosis noted as well.  Foley catheter has been placed.  Good urine output noted at 5.1 L over the preceding 24 hours.  Continue Foley drainage.  He will need definitive management as an outpatient by urology.  No acute indication for dialysis as BUN and creatinine coming down spontaneously with diuresis.   2.  Bilateral hydronephrosis.  Secondary to prostatomegaly.  Urology has been consulted.  Foley catheter has  been placed.  Further management as an outpatient.  3.  Hypokalemia.  Serum potassium low at 3.4.  He is likely to have ongoing hypokalemia given rapid diuresis.  We will administer potassium chloride 40 mEq IV.  4.  Metabolic  acidosis.  Improved as serum bicarbonate up to 18.  With relief of obstruction his acidosis should also improve.  5.  Thanks for consultation.

## 2019-02-17 NOTE — Progress Notes (Signed)
Nutrition Brief Note  Patient identified on the Malnutrition Screening Tool (MST) Report  73 y.o. male with a PMHx of anxiety, coronary artery disease, diabetes mellitus type 2, environmental allergies, hypertension, lymphedema, who was admitted to [facility] on 02/16/2019 for evaluation of back and abdominal pain for 1 week prior to admission. Pt found to have bladder outlet obstruction with massive urinary retention with hydroureteronephrosis bilaterally with left periureteral stranding/possible forniceal rupture and acute kidney injury.  Wt Readings from Last 15 Encounters:  02/16/19 89.8 kg  12/02/18 92.1 kg  12/03/17 96.6 kg  05/14/17 93.9 kg  03/11/17 91.2 kg  11/13/16 91.2 kg  08/14/16 93.9 kg  07/09/16 94.3 kg  07/02/16 93 kg  06/25/16 92.5 kg  06/18/16 93.4 kg  06/12/16 92.1 kg  06/05/16 95.3 kg  05/29/16 95.7 kg  05/22/16 97.5 kg    Body mass index is 31.96 kg/m. Patient meets criteria for obesity based on current BMI. Pt appears fairly weight stable pta.   Current diet order is HH, patient is consuming approximately 75-95% of meals at this time. RD will liberalize pt's diet.    Labs and medications reviewed.   No nutrition interventions warranted at this time. If nutrition issues arise, please consult RD.   Koleen Distance MS, RD, LDN Pager #- [phone] Office#- [phone] After Hours Pager: [phone]

## 2019-02-18 LAB — GLUCOSE, CAPILLARY
Glucose-Capillary: 139 mg/dL — ABNORMAL HIGH (ref 70–99)
Glucose-Capillary: 173 mg/dL — ABNORMAL HIGH (ref 70–99)
Glucose-Capillary: 122 mg/dL — ABNORMAL HIGH (ref 70–99)
Glucose-Capillary: 175 mg/dL — ABNORMAL HIGH (ref 70–99)

## 2019-02-18 LAB — CBC
HCT: 37.2 % — ABNORMAL LOW (ref 39.0–52.0)
Hemoglobin: 12.6 g/dL — ABNORMAL LOW (ref 13.0–17.0)
MCH: 29.6 pg (ref 26.0–34.0)
MCHC: 33.9 g/dL (ref 30.0–36.0)
MCV: 87.5 fL (ref 80.0–100.0)
Platelets: 286 10*3/uL (ref 150–400)
RBC: 4.25 MIL/uL (ref 4.22–5.81)
RDW: 13.7 % (ref 11.5–15.5)
WBC: 10.2 10*3/uL (ref 4.0–10.5)
nRBC: 0 % (ref 0.0–0.2)

## 2019-02-18 LAB — BASIC METABOLIC PANEL
Anion gap: 13 (ref 5–15)
BUN: 81 mg/dL — ABNORMAL HIGH (ref 8–23)
CO2: 21 mmol/L — ABNORMAL LOW (ref 22–32)
Calcium: 7.3 mg/dL — ABNORMAL LOW (ref 8.9–10.3)
Chloride: 105 mmol/L (ref 98–111)
Creatinine, Ser: 1.43 mg/dL — ABNORMAL HIGH (ref 0.61–1.24)
GFR calc Af Amer: 56 mL/min — ABNORMAL LOW (ref >60)
GFR calc non Af Amer: 48 mL/min — ABNORMAL LOW (ref >60)
Glucose, Bld: 136 mg/dL — ABNORMAL HIGH (ref 70–99)
Potassium: 3.7 mmol/L (ref 3.5–5.1)
Sodium: 139 mmol/L (ref 135–145)

## 2019-02-18 LAB — PHOSPHORUS: Phosphorus: 2.7 mg/dL (ref 2.5–4.6)

## 2019-02-18 LAB — MAGNESIUM: Magnesium: 2.3 mg/dL (ref 1.7–2.4)

## 2019-02-18 MED ORDER — TAMSULOSIN HCL 0.4 MG PO CAPS: 0.4000 mg | ORAL_CAPSULE | Freq: Every day | ORAL | 0 refills | Status: DC

## 2019-02-18 MED ORDER — GLYBURIDE 2.5 MG PO TABS: 2.5000 mg | ORAL_TABLET | Freq: Every day | ORAL | 0 refills | Status: DC

## 2019-02-18 NOTE — Progress Notes (Signed)
[facility]  ROUNDING NOTE   Subjective:  Patient significantly improved. Currently awake and alert. Urine output was 1.6 L over the preceding 24 hours. Creatinine down to 1.4.   Objective:  Vital signs in last 24 hours:  Temp:  [98 F (36.7 C)-98.2 F (36.8 C)] 98.2 F (36.8 C) (10/14 0512) Pulse Rate:  [70-87] 81 (10/14 1202) Resp:  [20] 20 (10/14 0512) BP: (97-141)/(60-74) 141/73 (10/14 1202) SpO2:  [97 %-100 %] 100 % (10/14 1202)  Weight change:  Filed Weights   02/16/19 1113  Weight: 89.8 kg    Intake/Output: I/O last 3 completed shifts: In: 2295.3 [P.O.:360; I.V.:1785.3; IV Piggyback:150] Out: 4450 [Urine:4450]   Intake/Output this shift:  Total I/O In: 240 [P.O.:240] Out: -   Physical Exam: General: No acute distress  Head: Normocephalic, atraumatic. Moist oral mucosal membranes  Eyes: Anicteric  Neck: Supple, trachea midline  Lungs:  Clear to auscultation, normal effort  Heart: S1S2 no rubs  Abdomen:  Soft, nontender, bowel sounds present  Extremities: Trace peripheral edema.  Neurologic: Awake, alert, following commands  Skin: No lesions       Basic Metabolic Panel: Recent Labs  Lab 02/16/19 1129 02/16/19 1740 02/17/19 0514 02/18/19 0422  NA 132* 134* 135 139  K 6.5* 4.7 3.4* 3.7  CL 96* 100 103 105  CO2 13* 13* 18* 21*  GLUCOSE 140* 74 118* 136*  BUN 205* 181* 148* 81*  CREATININE 15.94* 12.57* 5.59* 1.43*  CALCIUM 7.4* 7.6* 7.0* 7.3*  MG  --  2.7*  --  2.3  PHOS  --   --   --  2.7    Liver Function Tests: Recent Labs  Lab 02/16/19 1129  AST 8*  ALT 9  ALKPHOS 54  BILITOT 1.0  PROT 7.4  ALBUMIN 3.2*   Recent Labs  Lab 02/16/19 1129  LIPASE 37   No results for input(s): AMMONIA in the last 168 hours.  CBC: Recent Labs  Lab 02/16/19 1129 02/17/19 0514 02/18/19 0422  WBC 20.5* 13.2* 10.2  HGB 14.2 12.2* 12.6*  HCT 41.7 35.4* 37.2*  MCV 88.9 86.6 87.5  PLT 365 270 286    Cardiac Enzymes: No  results for input(s): CKTOTAL, CKMB, CKMBINDEX, TROPONINI in the last 168 hours.  BNP: Invalid input(s): POCBNP  CBG: Recent Labs  Lab 02/17/19 1741 02/17/19 2128 02/18/19 0746 02/18/19 1201  GLUCAP 191* 140* 139* 173*    Microbiology: Results for orders placed or performed during the hospital encounter of 02/16/19  Urine culture     Status: Abnormal   Collection Time: 02/16/19  1:03 PM   Specimen: Urine, Random  Result Value Ref Range Status   Specimen Description   Final    URINE, RANDOM Performed at [facility], [address], [place], [place] [address]    Special Requests   Final    NONE Performed at [facility], [address] [place], [place] [address]    Culture MULTIPLE SPECIES PRESENT, SUGGEST RECOLLECTION (A)  Final   Report Status 02/17/2019 FINAL  Final  SARS CORONAVIRUS 2 (TAT 6-24 HRS) Nasopharyngeal Nasopharyngeal Swab     Status: None   Collection Time: 02/16/19  1:54 PM   Specimen: Nasopharyngeal Swab  Result Value Ref Range Status   SARS Coronavirus 2 NEGATIVE NEGATIVE Final    Comment: (NOTE) SARS-CoV-2 target nucleic acids are NOT DETECTED. The SARS-CoV-2 RNA is generally detectable in upper and lower respiratory specimens during the acute phase of infection. Negative  results do not preclude SARS-CoV-2 infection, do not rule out co-infections with other pathogens, and should not be used as the sole basis for treatment or other patient management decisions. Negative results must be combined with clinical observations, patient history, and epidemiological information. The expected result is Negative. Fact Sheet for Patients: SugarRoll.be Fact Sheet for Healthcare Providers: https://www.woods-mathews.com/ This test is not yet approved or cleared by the [place] FDA and  has been authorized for detection and/or diagnosis of SARS-CoV-2 by FDA under an Emergency Use Authorization  (EUA). This EUA will remain  in effect (meaning this test can be used) for the duration of the COVID-19 declaration under Section 56 4(b)(1) of the Act, 21 U.S.C. section 360bbb-3(b)(1), unless the authorization is terminated or revoked sooner. Performed at [facility], [place] [address] [place], [place] [address]     Coagulation Studies: No results for input(s): LABPROT, INR in the last 72 hours.  Urinalysis: Recent Labs    02/16/19 1129  COLORURINE YELLOW*  LABSPEC 1.012  PHURINE 5.0  GLUCOSEU NEGATIVE  HGBUR NEGATIVE  BILIRUBINUR NEGATIVE  KETONESUR NEGATIVE  PROTEINUR NEGATIVE  NITRITE NEGATIVE  LEUKOCYTESUR NEGATIVE      Imaging: No results found.   Medications:   . sodium chloride Stopped (02/17/19 1747)  .  sodium bicarbonate (isotonic) infusion in sterile water 75 mL/hr at 02/18/19 0432   . aspirin EC  81 mg Oral Daily  . Chlorhexidine Gluconate Cloth  6 each Topical Daily  . finasteride  5 mg Oral Daily  . heparin  5,000 Units Subcutaneous Q8H  . insulin aspart  0-5 Units Subcutaneous QHS  . insulin aspart  0-9 Units Subcutaneous TID WC  . sodium chloride flush  3 mL Intravenous Once  . tamsulosin  0.4 mg Oral QPC supper   sodium chloride, acetaminophen **OR** acetaminophen, ondansetron **OR** ondansetron (ZOFRAN) IV, oxyCODONE  Assessment/ Plan:  73 y.o. male with a PMHx of anxiety, coronary artery disease, diabetes mellitus type 2, environmental allergies, hypertension, lymphedema, who was admitted to [facility] on 02/16/2019 for evaluation of back and abdominal pain for 1 week prior to admission.   1.  Acute kidney injury secondary to urinary obstruction.  Renal function has markedly improved over the past several days.  Creatinine now down to 1.4.  Good urine output noted.  He will need definitive treatment of his underlying obstruction to urology as an outpatient.  2.  Bilateral hydronephrosis.  Secondary to prostatomegaly.  Foley catheter in  place.  Additional urologic intervention as outpatient.  3.  Hypokalemia.  Serum potassium 3.7 and acceptable.  Continue to monitor.  4.  Metabolic acidosis.  Significantly improved with relief of obstruction.  Okay to continue sodium bicarbonate 1 additional day.   LOS: 2 Jim Guerra 10/14/20202:14 PM

## 2019-02-18 NOTE — Discharge Summary (Addendum)
[facility] [place] [facility]   PATIENT NAME: Jim Guerra    MR#:  [id number]  DATE OF BIRTH:  [birth year]  DATE OF ADMISSION:  02/16/2019 ADMITTING PHYSICIAN: Alford Highlandichard Wieting, MD  DATE OF DISCHARGE: 02/19/2019  PRIMARY CARE PHYSICIAN: Marguarite ArbourSparks, Jeffrey D, MD    ADMISSION DIAGNOSIS:  Acute renal failure, unspecified acute renal failure type (HCC) [N17.9]  DISCHARGE DIAGNOSIS:  Active Problems:   Acute kidney injury (HCC)   SECONDARY DIAGNOSIS:   Past Medical History:  Diagnosis Date  . Anxiety   . Arthritis   . Coronary artery disease   . Diabetes mellitus without complication (HCC)   . Elevated lipids   . Environmental allergies   . HOH (hard of hearing)   . Hypertension   . Lymphedema of both lower extremities     HOSPITAL COURSE:  73 year old male with diabetes who presents to the emergency room due to back pain and abdominal pain.  1.Severe hyperkalemia due to acute kidney injury which is resolved after treatment and improvement in renal function..  2.  Acute kidney injury due to bilateral hydronephrosis: Creatinine has improved after placement of Foley catheter.  Patient evaluated by urology.  Plan to continue with Foley catheter for now and have a voiding trial in 1 to 2 weeks.  Patient will continue on Flomax as well as finasteride.  3.  Essential hypertension: Patient may resume outpatient regimen of blood pressure medication.  4.  New diagnosis of diabetes: He has been started on glyburide and will  will need outpatient follow-up regarding this.  A1c is reported at 7.0.  He will continue ADA diet   5. Arm swelling from IV infiltration  Use compress and ICE.  DISCHARGE CONDITIONS AND DIET:   Stable for discharge diabetic heart healthy diet  CONSULTS OBTAINED:  Treatment Team:  Vanna ScotlandBrandon, Ashley, MD Mady HaagensenLateef, Munsoor, MD Riki AltesStoioff, Scott C, MD  DRUG ALLERGIES:  No Known Allergies  DISCHARGE MEDICATIONS:   Allergies as of  02/18/2019   No Known Allergies     Medication List    TAKE these medications   aspirin EC 81 MG tablet Take 81 mg by mouth daily.   finasteride 5 MG tablet Commonly known as: PROSCAR Take 5 mg by mouth daily.   glyBURIDE 2.5 MG tablet Commonly known as: DIABETA Take 1 tablet (2.5 mg total) by mouth daily with breakfast.   tamsulosin 0.4 MG Caps capsule Commonly known as: FLOMAX Take 1 capsule (0.4 mg total) by mouth daily after supper.   valsartan-hydrochlorothiazide 320-25 MG tablet Commonly known as: DIOVAN-HCT Take 1 tablet by mouth daily.         Today   CHIEF COMPLAINT:  No acute events overnight.  Patient doing okay this morning.   VITAL SIGNS:  Blood pressure 132/60, pulse 70, temperature 98.2 F (36.8 C), temperature source Oral, resp. rate 20, height 5\' 6"  (1.676 m), weight 89.8 kg, SpO2 97 %.   REVIEW OF SYSTEMS:  Review of Systems  Constitutional: Negative.  Negative for chills, fever and malaise/fatigue.  HENT: Negative.  Negative for ear discharge, ear pain, hearing loss, nosebleeds and sore throat.   Eyes: Negative.  Negative for blurred vision and pain.  Respiratory: Negative.  Negative for cough, hemoptysis, shortness of breath and wheezing.   Cardiovascular: Negative.  Negative for chest pain, palpitations and leg swelling.  Gastrointestinal: Negative.  Negative for abdominal pain, blood in stool, diarrhea, nausea and vomiting.  Genitourinary: Negative.  Negative for dysuria.  Musculoskeletal:  Negative.  Negative for back pain.  Skin: Negative.   Neurological: Negative for dizziness, tremors, speech change, focal weakness, seizures and headaches.  Endo/Heme/Allergies: Negative.  Does not bruise/bleed easily.  Psychiatric/Behavioral: Negative.  Negative for depression, hallucinations and suicidal ideas.     PHYSICAL EXAMINATION:  GENERAL:  73 y.o.-year-old patient lying in the bed with no acute distress.  NECK:  Supple, no jugular venous  distention. No thyroid enlargement, no tenderness.  LUNGS: Normal breath sounds bilaterally, no wheezing, rales,rhonchi  No use of accessory muscles of respiration.  CARDIOVASCULAR: S1, S2 normal. No murmurs, rubs, or gallops.  ABDOMEN: Soft, non-tender, non-distended. Bowel sounds present. No organomegaly or mass.  EXTREMITIES: No pedal edema, cyanosis, or clubbing.  Arm swelling good ROM  PSYCHIATRIC: The patient is alert and oriented x 3.  SKIN: No obvious rash, lesion, or ulcer.   DATA REVIEW:   CBC Recent Labs  Lab 02/18/19 0422  WBC 10.2  HGB 12.6*  HCT 37.2*  PLT 286    Chemistries  Recent Labs  Lab 02/16/19 1129  02/18/19 0422  NA 132*   < > 139  K 6.5*   < > 3.7  CL 96*   < > 105  CO2 13*   < > 21*  GLUCOSE 140*   < > 136*  BUN 205*   < > 81*  CREATININE 15.94*   < > 1.43*  CALCIUM 7.4*   < > 7.3*  MG  --    < > 2.3  AST 8*  --   --   ALT 9  --   --   ALKPHOS 54  --   --   BILITOT 1.0  --   --    < > = values in this interval not displayed.    Cardiac Enzymes No results for input(s): TROPONINI in the last 168 hours.  Microbiology Results  @MICRORSLT48 @  RADIOLOGY:  Ct Abdomen Pelvis Wo Contrast  Result Date: 02/16/2019 CLINICAL DATA:  Back pain, abdominal pain EXAM: CT ABDOMEN AND PELVIS WITHOUT CONTRAST TECHNIQUE: Multidetector CT imaging of the abdomen and pelvis was performed following the standard protocol without IV contrast. COMPARISON:  Renal ultrasound 01/07/2009 FINDINGS: Lower chest: Small to moderate left-sided pleural effusion with associated left basilar atelectasis. Right lung bases clear. Hepatobiliary: Liver is within normal limits. Multiple gallstones within the gallbladder lumen. No pericholecystic inflammatory changes. Pancreas: Fatty atrophy of the pancreas without ductal dilatation or surrounding inflammatory change. Spleen: Within normal limits. Adrenals/Urinary Tract: Bilateral adrenal glands unremarkable. Moderate to severe left and  moderate right hydronephrosis. The bilateral ureters are mildly dilated. No renal or ureteral calculi. There is perinephric fluid and stranding, left worse than right. Urinary bladder is markedly distended. Stomach/Bowel: Stomach is within normal limits. Appendix appears normal. Scattered colonic diverticulosis. No evidence of bowel wall thickening, distention, or inflammatory changes. Vascular/Lymphatic: Aortic atherosclerosis. No enlarged abdominal or pelvic lymph nodes. Reproductive: Prostatomegaly. A multilobulated prostate gland results in significant mass effect on the base of the urinary bladder. Prostate measures approximately 5.6 x 5.4 x 6.2 cm. Other: Small fat containing umbilical hernia. Prominent fat contact within the bilateral inguinal canals, right greater than left. No pneumoperitoneum. Musculoskeletal: No acute osseous findings. IMPRESSION: 1. Moderate-to-severe left and moderate right hydroureteronephrosis with marked urinary bladder distention. There is prominent left greater than right perinephric fluid and stranding suggesting acute obstruction. Overall, these findings suggest bladder outlet obstruction secondary to marked prostatomegaly. 2. Small to moderate left-sided pleural effusion. 3. Cholelithiasis. 4. Diverticulosis  without evidence of diverticulitis. Electronically Signed   By: Duanne Guess M.D.   On: 02/16/2019 13:35   Dg Chest Port 1 View  Result Date: 02/16/2019 CLINICAL DATA:  Shortness of breath EXAM: PORTABLE CHEST 1 VIEW COMPARISON:  Chest radiograph dated 02/01/2009 FINDINGS: The cardiac silhouette is partially obscured, but does not appear significantly changed. A moderate left pleural effusion with associated atelectasis/airspace disease is seen. There is mild right basilar atelectasis/airspace disease. There is no right pleural effusion. There is no pneumothorax. Degenerative changes are seen in the shoulders and the spine. IMPRESSION: 1. Moderate left pleural  effusion with associated atelectasis/airspace disease. 2. Mild right basilar atelectasis/airspace disease. Electronically Signed   By: Romona Curls M.D.   On: 02/16/2019 13:02      Allergies as of 02/18/2019   No Known Allergies     Medication List    TAKE these medications   aspirin EC 81 MG tablet Take 81 mg by mouth daily.   finasteride 5 MG tablet Commonly known as: PROSCAR Take 5 mg by mouth daily.   glyBURIDE 2.5 MG tablet Commonly known as: DIABETA Take 1 tablet (2.5 mg total) by mouth daily with breakfast.   tamsulosin 0.4 MG Caps capsule Commonly known as: FLOMAX Take 1 capsule (0.4 mg total) by mouth daily after supper.   valsartan-hydrochlorothiazide 320-25 MG tablet Commonly known as: DIOVAN-HCT Take 1 tablet by mouth daily.          Management plans discussed with the patient and he is in agreement. Stable for discharge   Patient should follow up with urology  CODE STATUS:     Code Status Orders  (From admission, onward)         Start     Ordered   02/16/19 1424  Full code  Continuous     02/16/19 1424        Code Status History    This patient has a current code status but no historical code status.   Advance Care Planning Activity    Advance Directive Documentation     Most Recent Value  Type of Advance Directive  Healthcare Power of Attorney  Pre-existing out of facility DNR order (yellow form or pink MOST form)  -  "MOST" Form in Place?  -      TOTAL TIME TAKING CARE OF THIS PATIENT: 38 minutes.    Note: This dictation was prepared with Dragon dictation along with smaller phrase technology. Any transcriptional errors that result from this process are unintentional.  Adrian Saran M.D on 02/18/2019 at 11:10 AM  Between 7am to 6pm - Pager - [phone] After 6pm go to [email or web address] - Social research officer, government  [facility]  Office  [phone]  CC: Primary care physician; Marguarite Arbour, MD

## 2019-02-18 NOTE — NC FL2 (Signed)
[place] LEVEL OF CARE SCREENING TOOL     IDENTIFICATION  Patient Name: Jim Guerra. Birthdate: Feb 04, 1946 Sex: male Admission Date (Current Location): 02/16/2019  [place] and [place] Number:  Engineering geologist and Address:  [facility], [address] [place], [place] [address]      Provider Number: [id number]  Attending Physician Name and Address:  Bettey Costa, MD  Relative Name and Phone Number:  [place], [place] [phone] or [place]   [phone]    Current Level of Care: Hospital Recommended Level of Care: [place] Prior Approval Number:    Date Approved/Denied:   PASRR Number: [id number]  Discharge Plan: SNF    Current Diagnoses: Patient Active Problem List   Diagnosis Date Noted  . Acute kidney injury ([place]) 02/16/2019  . Essential hypertension, benign 11/13/2016  . Venous ulcer of ankle ([place]) 06/18/2016  . Cellulitis of lower extremity 06/12/2016  . Lymphedema 06/12/2016  . Swelling of lower extremity 05/22/2016    Orientation RESPIRATION BLADDER Height & Weight     Self, Time, Situation, Place  Normal Continent Weight: 198 lb (89.8 kg) Height:  5\' 6"  (167.6 cm)  BEHAVIORAL SYMPTOMS/MOOD NEUROLOGICAL BOWEL NUTRITION STATUS      Continent Diet(diabetic heart healthy diet)  AMBULATORY STATUS COMMUNICATION OF NEEDS Skin   Limited Assist Verbally Normal                       Personal Care Assistance Level of Assistance  Feeding, Bathing, Dressing Bathing Assistance: Limited assistance Feeding assistance: Independent Dressing Assistance: Limited assistance     Functional Limitations Info  Sight, Hearing, Speech Sight Info: Adequate Hearing Info: Impaired Speech Info: Adequate    SPECIAL CARE FACTORS FREQUENCY  PT (By licensed PT), OT (By licensed OT)     PT Frequency: Minimum 5x a week OT Frequency: Minimum 5x a week            Contractures Contractures Info: Not present    Additional Factors Info  Code Status, Allergies, Insulin Sliding Scale Code Status Info: Full Code Allergies Info: NKA   Insulin Sliding Scale Info: insulin aspart (novoLOG) injection 0-9 Units 3x a day with meals.       Current Medications (02/18/2019):  This is the current hospital active medication list Current Facility-Administered Medications  Medication Dose Route Frequency Provider Last Rate Last Dose  . 0.9 %  sodium chloride infusion   Intravenous PRN Otila Back, MD   Stopped at 02/17/19 1747  . acetaminophen (TYLENOL) tablet 650 mg  650 mg Oral Q6H PRN Loletha Grayer, MD       Or  . acetaminophen (TYLENOL) suppository 650 mg  650 mg Rectal Q6H PRN Loletha Grayer, MD      . aspirin EC tablet 81 mg  81 mg Oral Daily Loletha Grayer, MD   81 mg at 02/18/19 1027  . Chlorhexidine Gluconate Cloth 2 % PADS 6 each  6 each Topical Daily Stark Jock, Jude, MD   6 each at 02/18/19 1028  . finasteride (PROSCAR) tablet 5 mg  5 mg Oral Daily Loletha Grayer, MD   5 mg at 02/18/19 1027  . heparin injection 5,000 Units  5,000 Units Subcutaneous Q8H Loletha Grayer, MD   5,000 Units at 02/18/19 0534  . insulin aspart (novoLOG) injection 0-5 Units  0-5 Units Subcutaneous QHS Ojie, Jude, MD      . insulin aspart (novoLOG) injection 0-9 Units  0-9 Units Subcutaneous TID WC Ojie, Jude, MD   1 Units at 02/18/19 1028  . ondansetron (ZOFRAN) tablet 4 mg  4 mg Oral Q6H PRN Alford Highland, MD       Or  . ondansetron (ZOFRAN) injection 4 mg  4 mg Intravenous Q6H PRN Wieting, Richard, MD      . oxyCODONE (Oxy IR/ROXICODONE) immediate release tablet 5 mg  5 mg Oral Q4H PRN Wieting, Richard, MD      . sodium bicarbonate 150 mEq in sterile water 1,000 mL infusion   Intravenous Continuous Enid Baas, Jude, MD 75 mL/hr at 02/18/19 0432    . sodium chloride flush (NS) 0.9 % injection 3 mL  3 mL Intravenous Once Wieting, Richard, MD      . tamsulosin [facility])  capsule 0.4 mg  0.4 mg Oral QPC supper Alford Highland, MD   0.4 mg at 02/17/19 1836     Discharge Medications: Please see discharge summary for a list of discharge medications.  Relevant Imaging Results:  Relevant Lab Results:   Additional Information SSN [id number]  Darleene Cleaver, LCSW

## 2019-02-18 NOTE — TOC Initial Note (Signed)
Transition of Care (TOC) - Initial/Assessment Note    Patient Details  Name: Jim Guerra. MRN: [id number] Date of Birth: 03-26-1946  Transition of Care [facility]) CM/SW Contact:    Darleene Cleaver, LCSW Phone Number: 02/18/2019, 5:51 PM  Clinical Narrative:                  Patient is a 73 year old male who lives alone.  Patient is alert and oriented x4, but hard of hearing.  Patient's family assisted with assessment.  Patient has been to rehab at [facility] in the Past, family was pleased with the care he received there.  CSW informed them that they no longer do rehab from the community.  Patient's family was disappointed but expressed understanding.  Patient's family stated they would like either [facility], [facility] or [facility].  Patient's family were provided choice of facility and they chose [facility].  CSW spoke to [facility] and they can accept patient tomorrow once he has had his three day qualifying stay.  CSW was informed that the waiver is still in place for SNFs, however they still can not accept patient today.  CSW was also informed that nephrology is requiring him to have sodium bicarbonate for one more day.  CSW to continue to follow patient's progress throughout discharge planning.  Expected Discharge Plan: Skilled Nursing Facility Barriers to Discharge: Continued Medical Work up   Patient Goals and CMS Choice Patient states their goals for this hospitalization and ongoing recovery are:: To go to SNF for short term rehab, then return back home. CMS Medicare.gov Compare Post Acute Care list provided to:: Patient Choice offered to / list presented to : Patient, Adult Children  Expected Discharge Plan and Services Expected Discharge Plan: Skilled Nursing Facility In-house Referral: Clinical Social Work     Living arrangements for the past 2 months: Independent Living Facility Expected Discharge Date: 02/18/19                         HH Arranged: NA HH  Agency: NA     Representative spoke with at [facility] Agency: na  Prior Living Arrangements/Services Living arrangements for the past 2 months: Independent Living Facility Lives with:: Self Patient language and need for interpreter reviewed:: Yes Do you feel safe going back to the place where you live?: No   Patient and family feel that he needs short term rehab before returning back home.  Need for Family Participation in Patient Care: Yes (Comment) Care giver support system in place?: No (comment)   Criminal Activity/Legal Involvement Pertinent to Current Situation/Hospitalization: No - Comment as needed  Activities of Daily Living Home Assistive Devices/Equipment: None ADL Screening (condition at time of admission) Patient's cognitive ability adequate to safely complete daily activities?: Yes Is the patient deaf or have difficulty hearing?: No Does the patient have difficulty seeing, even when wearing glasses/contacts?: No Does the patient have difficulty concentrating, remembering, or making decisions?: No Patient able to express need for assistance with ADLs?: Yes Does the patient have difficulty dressing or bathing?: No Independently performs ADLs?: Yes (appropriate for developmental age) Does the patient have difficulty walking or climbing stairs?: Yes Weakness of Legs: Both Weakness of Arms/Hands: None  Permission Sought/Granted Permission sought to share information with : Facility Medical sales representative, Family Supports Permission granted to share information with : Yes, Verbal Permission Granted  Share Information with NAME: vickie,[place] Legal Guardian   [phone]  Permission granted to share info  w AGENCY: SNF admissions        Emotional Assessment Appearance:: Appears stated age   Affect (typically observed): Accepting, Appropriate, Calm   Alcohol / Substance Use: Not Applicable Psych Involvement: No (comment)  Admission diagnosis:  Acute renal failure,  unspecified acute renal failure type [facility]) [N17.9] Patient Active Problem List   Diagnosis Date Noted  . Acute kidney injury ([place]) 02/16/2019  . Essential hypertension, benign 11/13/2016  . Venous ulcer of ankle ([place]) 06/18/2016  . Cellulitis of lower extremity 06/12/2016  . Lymphedema 06/12/2016  . Swelling of lower extremity 05/22/2016   PCP:  Idelle Crouch, MD Pharmacy:   [facility], [place] [address] [place] [address] Phone: [phone] Fax: [phone]     Social Determinants of Health (SDOH) Interventions    Readmission Risk Interventions No flowsheet data found.

## 2019-02-18 NOTE — Progress Notes (Signed)
Physical Therapy Treatment Patient Details Name: Jim Guerra. MRN: [id number] DOB: 1945/09/22 Today's Date: 02/18/2019    History of Present Illness Pt is a16 y/o M presents to ED with 1 week history of back pain and severe abdominal pain, chills and difficulty voiding. 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 Foley placed for urinary decompression.    PT Comments    Pt on bedpan upon arrival with medium BM.  Care provided.  To edge of bed with mod a x 1- difficulty sequencing task and needed verbal and tactile cues for hand placements and still reaching for writers hand to pull on.  Sitting steady.  Stood with min/mod a x 1 with most assist for balance to prevent post fall.  He was able to transfer to recliner and remain standing for MD visit.  After seated rest due to fatigue, he was able to stand again and complete standing exercises below x 10.  Continues with frequent post LOB and voicing fear of falling and frequently taking his hands off the walker when he does so.  Phone rings and pt lets to of walker with both hands while turning to reach the phone.  Pt demonstrated decreased safety awareness which would likely result in falls at home.  SNF remains appropriate for pt at this time.   Follow Up Recommendations  SNF     Equipment Recommendations  Rolling walker with 5" wheels    Recommendations for Other Services       Precautions / Restrictions Precautions Precautions: Fall Restrictions Weight Bearing Restrictions: No    Mobility  Bed Mobility Overal bed mobility: Needs Assistance Bed Mobility: Supine to Sit     Supine to sit: Mod assist     General bed mobility comments: heavy verbal cues for sequencing and hand placement.  Transfers Overall transfer level: Needs assistance Equipment used: Rolling walker (2 wheeled) Transfers:  Sit to/from Stand Sit to Stand: Min assist;Mod assist         General transfer comment: post LOB needing assist at times to prevent falls.  Ambulation/Gait Ambulation/Gait assistance: Min assist;Mod assist Gait Distance (Feet): 5 Feet Assistive device: Rolling walker (2 wheeled) Gait Pattern/deviations: Step-through pattern;Decreased step length - right;Decreased step length - left Gait velocity: decreased   General Gait Details: Post LOB's with assist to prevent falls backwards during transfer to chair.   Stairs             Wheelchair Mobility    Modified Rankin (Stroke Patients Only)       Balance Overall balance assessment: Needs assistance Sitting-balance support: Feet supported;Single extremity supported Sitting balance-Leahy Scale: Good     Standing balance support: Bilateral upper extremity supported Standing balance-Leahy Scale: Poor Standing balance comment: frequent post LOB's with static and dynamic acticities - assist to prevent fall, unable to self recover.                            Cognition Arousal/Alertness: Awake/alert Behavior During Therapy: Anxious Overall Cognitive Status: Within Functional Limits for tasks assessed                                        Exercises Other Exercises Other Exercises: standing marching in place and SLR with post LOB despite walker  support.    General Comments        Pertinent Vitals/Pain Pain Assessment: 0-10 Pain Score: 3  Pain Location: R knee and bilateral legs - also complains of itching of LEs Pain Descriptors / Indicators: Aching;Tender Pain Intervention(s): Limited activity within patient's tolerance;Monitored during session    Home Living                      Prior Function            PT Goals (current goals can now be found in the care plan section) Progress towards PT goals: Progressing toward goals    Frequency    Min 2X/week      PT  Plan Current plan remains appropriate    Co-evaluation              AM-PAC PT "6 Clicks" Mobility   Outcome Measure  Help needed turning from your back to your side while in a flat bed without using bedrails?: A Lot Help needed moving from lying on your back to sitting on the side of a flat bed without using bedrails?: A Lot Help needed moving to and from a bed to a chair (including a wheelchair)?: A Lot Help needed standing up from a chair using your arms (e.g., wheelchair or bedside chair)?: A Lot Help needed to walk in hospital room?: A Lot Help needed climbing 3-5 steps with a railing? : A Lot 6 Click Score: 12    End of Session Equipment Utilized During Treatment: Gait belt Activity Tolerance: Patient tolerated treatment well Patient left: in chair;with call bell/phone within reach;with chair alarm set Nurse Communication: Mobility status Pain - Right/Left: Right Pain - part of body: Knee;Leg     Time: 7026-3785 PT Time Calculation (min) (ACUTE ONLY): 24 min  Charges:  $Therapeutic Exercise: 8-22 mins $Therapeutic Activity: 8-22 mins                    Chesley Noon, PTA 02/18/19, 10:45 AM

## 2019-02-19 ENCOUNTER — Inpatient Hospital Stay: Payer: Medicare Other

## 2019-02-19 LAB — URINE CULTURE: Culture: NO GROWTH

## 2019-02-19 LAB — GLUCOSE, CAPILLARY
Glucose-Capillary: 135 mg/dL — ABNORMAL HIGH (ref 70–99)
Glucose-Capillary: 146 mg/dL — ABNORMAL HIGH (ref 70–99)

## 2019-02-19 NOTE — Progress Notes (Signed)
[place]. to be D/C'd [employer] per MD order.  Discussed prescriptions and follow up appointments with the patient. Prescriptions given to patient, medication list explained in detail. Pt verbalized understanding.  Allergies as of 02/19/2019   No Known Allergies      Medication List     TAKE these medications    aspirin EC 81 MG tablet Take 81 mg by mouth daily.   finasteride 5 MG tablet Commonly known as: PROSCAR Take 5 mg by mouth daily.   glyBURIDE 2.5 MG tablet Commonly known as: DIABETA Take 1 tablet (2.5 mg total) by mouth daily with breakfast.   tamsulosin 0.4 MG Caps capsule Commonly known as: FLOMAX Take 1 capsule (0.4 mg total) by mouth daily after supper.   valsartan-hydrochlorothiazide 320-25 MG tablet Commonly known as: DIOVAN-HCT Take 1 tablet by mouth daily.        Vitals:   02/19/19 0449 02/19/19 1342  BP: 130/67 (!) 144/62  Pulse: 70 72  Resp: 20 19  Temp: 98.8 F (37.1 C) 99.5 F (37.5 C)  SpO2: 95% 98%    Skin clean, dry and intact without evidence of skin break down, no evidence of skin tears noted. IV catheter discontinued intact. Site without signs and symptoms of complications. Dressing and pressure applied. Pt denies pain at this time. No complaints noted.  An After Visit Summary was printed and given to the patient. Patient escorted via [place], and D/C home via private auto.  Marion A Lavone Barrientes

## 2019-02-19 NOTE — TOC Transition Note (Signed)
Transition of Care [facility]) - CM/SW Discharge Note   Patient Details  Name: Seiya Silsby. MRN: [id number] Date of Birth: Dec 15, 1945  Transition of Care [facility]) CM/SW Contact:  Annamaria Boots, Latanya Presser Phone Number: 02/19/2019, 1:27 PM   Clinical Narrative:   Patient is medically ready for discharge today to [facility]. CSW notified legal guardian Karlin Heilman of discharge today. CSW also notified Rick at [facility] of discharge today. Patient will be transported by EMS. RN to call report and call for transport.      Final next level of care: [place] Barriers to Discharge: No Barriers Identified   Patient Goals and CMS Choice Patient states their goals for this hospitalization and ongoing recovery are:: To go to SNF for short term rehab, then return back home. CMS Medicare.gov Compare Post Acute Care list provided to:: Legal Guardian Choice offered to / list presented to : [place] / Guardian  Discharge Placement PASRR number recieved: 02/18/19            Patient chooses bed at: [facility] Patient to be transferred to facility by: EMS   Patient and family notified of of transfer: 02/19/19  Discharge Plan and Services In-house Referral: Clinical Social Work                        HH Arranged: NA HH Agency: NA     Representative spoke with at [place]: na  Social Determinants of Health (SDOH) Interventions     Readmission Risk Interventions No flowsheet data found.

## 2019-02-19 NOTE — Progress Notes (Signed)
[place] [facility] [place] NAME: Jim Guerra    MR#:  [id number]  DATE OF BIRTH:  07/21/1945  SUBJECTIVE:  No acute issues working with PT this am  REVIEW OF SYSTEMS:    Review of Systems  Constitutional: Negative for fever, chills weight loss HENT: Negative for ear pain, nosebleeds, congestion, facial swelling, rhinorrhea, neck pain, neck stiffness and ear discharge.   Respiratory: Negative for cough, shortness of breath, wheezing  Cardiovascular: Negative for chest pain, palpitations and leg swelling.  Gastrointestinal: Negative for heartburn, abdominal pain, vomiting, diarrhea or consitpation Genitourinary: Negative for dysuria, urgency, frequency, hematuria Musculoskeletal: Negative for back pain or joint pain Neurological: Negative for dizziness, seizures, syncope, focal weakness,  numbness and headaches.  Hematological: Does not bruise/bleed easily.  Psychiatric/Behavioral: Negative for hallucinations, confusion, dysphoric mood    Tolerating Diet: yes      DRUG ALLERGIES:  No Known Allergies  VITALS:  Blood pressure 130/67, pulse 70, temperature 98.8 F (37.1 C), temperature source Oral, resp. rate 20, height 5\' 6"  (1.676 m), weight 89.8 kg, SpO2 95 %.  PHYSICAL EXAMINATION:  Constitutional: Appears well-developed and well-nourished. No distress. HENT: Normocephalic. Marland Kitchen Oropharynx is clear and moist.  Eyes: Conjunctivae and EOM are normal. PERRLA, no scleral icterus.  Neck: Normal ROM. Neck supple. No JVD. No tracheal deviation. CVS: RRR, S1/S2 +, no murmurs, no gallops, no carotid bruit.  Pulmonary: Effort and breath sounds normal, no stridor, rhonchi, wheezes, rales.  Abdominal: Soft. BS +,  no distension, tenderness, rebound or guarding.  Musculoskeletal: Normal range of motion. No edema and no tenderness.  Neuro: Alert. CN 2-12 grossly intact. No focal deficits. Skin: Skin is warm and dry. No rash noted. Psychiatric:  Normal mood and affect.      LABORATORY PANEL:   CBC Recent Labs  Lab 02/18/19 0422  WBC 10.2  HGB 12.6*  HCT 37.2*  PLT 286   ------------------------------------------------------------------------------------------------------------------  Chemistries  Recent Labs  Lab 02/16/19 1129  02/18/19 0422  NA 132*   < > 139  K 6.5*   < > 3.7  CL 96*   < > 105  CO2 13*   < > 21*  GLUCOSE 140*   < > 136*  BUN 205*   < > 81*  CREATININE 15.94*   < > 1.43*  CALCIUM 7.4*   < > 7.3*  MG  --    < > 2.3  AST 8*  --   --   ALT 9  --   --   ALKPHOS 54  --   --   BILITOT 1.0  --   --    < > = values in this interval not displayed.   ------------------------------------------------------------------------------------------------------------------  Cardiac Enzymes No results for input(s): TROPONINI in the last 168 hours. ------------------------------------------------------------------------------------------------------------------  RADIOLOGY:  No results found.   ASSESSMENT AND PLAN:   73 year old male with diabetes who presents to the emergency room due to back pain and abdominal pain.  1.Severe hyperkalemia due to acute kidney injury which is resolved after treatment and improvement in renal function..  2.  Acute kidney injury due to bilateral hydronephrosis: Creatinine has improved after placement of Foley catheter.  Patient evaluated by urology.  Plan to continue with Foley catheter for now and have a voiding trial in 1 to 2 weeks.  Patient will continue on Flomax as well as finasteride.  3.  Essential hypertension: Patient may resume outpatient regimen of blood pressure medication.  4.  New diagnosis of diabetes: He has been started on glyburide and will  will need outpatient follow-up regarding this.  A1c is reported at 7.0.  He will continue ADA diet       Management plans discussed with the patient and he is in agreement.  CODE STATUS: full  TOTAL TIME  TAKING CARE OF THIS PATIENT: 26 minutes.     POSSIBLE D/C today, DEPENDING ON insurance.   Adrian Saran M.D on 02/19/2019 at 10:33 AM  Between 7am to 6pm - Pager - [phone] After 6pm go to [email or web address] - password [email or web address]  [facility]  Office  [phone]  CC: Primary care physician; Marguarite Arbour, MD  Note: This dictation was prepared with Dragon dictation along with smaller phrase technology. Any transcriptional errors that result from this process are unintentional.

## 2019-02-19 NOTE — Care Management Important Message (Signed)
Important Message  Patient Details  Name: Jim Guerra. MRN: [id number] Date of Birth: 1945-12-31   Medicare Important Message Given:  Yes     Dannette Barbara 02/19/2019, 1:10 PM

## 2019-02-19 NOTE — Progress Notes (Signed)
[facility]  ROUNDING NOTE   Subjective:  Patient with a bit of infiltration in his right upper extremity. Urine output was 3 L over the preceding 24 hours. No new creatinine today.   Objective:  Vital signs in last 24 hours:  Temp:  [98.8 F (37.1 C)-99.5 F (37.5 C)] 99.5 F (37.5 C) (10/15 1342) Pulse Rate:  [70-72] 72 (10/15 1342) Resp:  [19-20] 19 (10/15 1342) BP: (128-144)/(54-67) 144/62 (10/15 1342) SpO2:  [95 %-98 %] 98 % (10/15 1342)  Weight change:  Filed Weights   02/16/19 1113  Weight: 89.8 kg    Intake/Output: I/O last 3 completed shifts: In: 1367 [P.O.:240; I.V.:1127] Out: 4650 [Urine:4650]   Intake/Output this shift:  Total I/O In: 480 [P.O.:480] Out: 1250 [Urine:1250]  Physical Exam: General: No acute distress  Head: Normocephalic, atraumatic. Moist oral mucosal membranes  Eyes: Anicteric  Neck: Supple, trachea midline  Lungs:  Clear to auscultation, normal effort  Heart: S1S2 no rubs  Abdomen:  Soft, nontender, bowel sounds present  Extremities: Trace peripheral edema.  Neurologic: Awake, alert, following commands  Skin: No lesions       Basic Metabolic Panel: Recent Labs  Lab 02/16/19 1129 02/16/19 1740 02/17/19 0514 02/18/19 0422  NA 132* 134* 135 139  K 6.5* 4.7 3.4* 3.7  CL 96* 100 103 105  CO2 13* 13* 18* 21*  GLUCOSE 140* 74 118* 136*  BUN 205* 181* 148* 81*  CREATININE 15.94* 12.57* 5.59* 1.43*  CALCIUM 7.4* 7.6* 7.0* 7.3*  MG  --  2.7*  --  2.3  PHOS  --   --   --  2.7    Liver Function Tests: Recent Labs  Lab 02/16/19 1129  AST 8*  ALT 9  ALKPHOS 54  BILITOT 1.0  PROT 7.4  ALBUMIN 3.2*   Recent Labs  Lab 02/16/19 1129  LIPASE 37   No results for input(s): AMMONIA in the last 168 hours.  CBC: Recent Labs  Lab 02/16/19 1129 02/17/19 0514 02/18/19 0422  WBC 20.5* 13.2* 10.2  HGB 14.2 12.2* 12.6*  HCT 41.7 35.4* 37.2*  MCV 88.9 86.6 87.5  PLT 365 270 286    Cardiac Enzymes: No  results for input(s): CKTOTAL, CKMB, CKMBINDEX, TROPONINI in the last 168 hours.  BNP: Invalid input(s): POCBNP  CBG: Recent Labs  Lab 02/18/19 1201 02/18/19 1622 02/18/19 2120 02/19/19 0740 02/19/19 1338  GLUCAP 173* 122* 175* 135* 146*    Microbiology: Results for orders placed or performed during the hospital encounter of 02/16/19  Urine culture     Status: Abnormal   Collection Time: 02/16/19  1:03 PM   Specimen: Urine, Random  Result Value Ref Range Status   Specimen Description   Final    URINE, RANDOM Performed at [facility], [address] [place], [place] [address]    Special Requests   Final    NONE Performed at [facility], [address] [place], [place] [address]    Culture MULTIPLE SPECIES PRESENT, SUGGEST RECOLLECTION (A)  Final   Report Status 02/17/2019 FINAL  Final  SARS CORONAVIRUS 2 (TAT 6-24 HRS) Nasopharyngeal Nasopharyngeal Swab     Status: None   Collection Time: 02/16/19  1:54 PM   Specimen: Nasopharyngeal Swab  Result Value Ref Range Status   SARS Coronavirus 2 NEGATIVE NEGATIVE Final    Comment: (NOTE) SARS-CoV-2 target nucleic acids are NOT DETECTED. The SARS-CoV-2 RNA is generally detectable in upper and lower respiratory specimens during the  acute phase of infection. Negative results do not preclude SARS-CoV-2 infection, do not rule out co-infections with other pathogens, and should not be used as the sole basis for treatment or other patient management decisions. Negative results must be combined with clinical observations, patient history, and epidemiological information. The expected result is Negative. Fact Sheet for Patients: HairSlick.no Fact Sheet for Healthcare Providers: quierodirigir.com This test is not yet approved or cleared by the [place] FDA and  has been authorized for detection and/or diagnosis of SARS-CoV-2 by FDA under an Emergency  Use Authorization (EUA). This EUA will remain  in effect (meaning this test can be used) for the duration of the COVID-19 declaration under Section 56 4(b)(1) of the Act, 21 U.S.C. section 360bbb-3(b)(1), unless the authorization is terminated or revoked sooner. Performed at [facility], [address] [place], [place] [address]   Urine Culture     Status: None   Collection Time: 02/17/19  1:46 PM   Specimen: Urine, Random  Result Value Ref Range Status   Specimen Description   Final    URINE, RANDOM Performed at [facility], [address] [place], [place] [address]    Special Requests   Final    NONE Performed at [facility], [address], [place], [place] [address]    Culture   Final    NO GROWTH Performed at [facility], [address] [place], [place] [address]    Report Status 02/19/2019 FINAL  Final    Coagulation Studies: No results for input(s): LABPROT, INR in the last 72 hours.  Urinalysis: No results for input(s): COLORURINE, LABSPEC, PHURINE, GLUCOSEU, HGBUR, BILIRUBINUR, KETONESUR, PROTEINUR, UROBILINOGEN, NITRITE, LEUKOCYTESUR in the last 72 hours.  Invalid input(s): APPERANCEUR    Imaging: US Venous Img Upper Uni Right  Result Date: 02/19/2019 CLINICAL DATA:  Right upper extremity pain. EXAM: RIGHT UPPER EXTREMITY VENOUS DOPPLER ULTRASOUND TECHNIQUE: Gray-scale sonography with graded compression, as well as color Doppler and duplex ultrasound were performed to evaluate the upper extremity deep venous system from the level of the subclavian vein and including the jugular, axillary, basilic, radial, ulnar and upper cephalic vein. Spectral Doppler was utilized to evaluate flow at rest and with distal augmentation maneuvers. COMPARISON:  None. FINDINGS: Contralateral Subclavian Vein: Color Doppler flow and normal phasicity. No evidence for thrombus. Internal Jugular Vein: No evidence of thrombus. Normal compressibility with  color Doppler flow and normal phasicity. Subclavian Vein: No evidence of thrombus. Color Doppler flow and normal phasicity. Axillary Vein: No evidence of thrombus. Normal compressibility with color Doppler flow and augmentation. Cephalic Vein: Not confidently identified. Basilic Vein: No evidence of thrombus. Normal compressibility with color Doppler flow and augmentation. Brachial Veins: No evidence of thrombus. Normal compressibility, respiratory phasicity and response to augmentation. Radial Veins: No evidence of thrombus. Normal compressibility and color Doppler flow. Ulnar Veins: No evidence of thrombus. Normal compressibility and color Doppler flow. Other Findings:  None visualized. IMPRESSION: No evidence of DVT within the right upper extremity. Electronically Signed   By: Richarda Overlie M.D.   On: 02/19/2019 12:54   Dg Hand Complete Right  Result Date: 02/19/2019 CLINICAL DATA:  Pt is having pain in his right hand; states it started yesterday after they put an IV in his hand; pt has difficulty moving his hand and straightening his fingers; EXAM: RIGHT HAND - COMPLETE 3+ VIEW COMPARISON:  None. FINDINGS: There is significant degenerative change at the radiocarpal joint with joint space narrowing  and osteophyte formation. Degenerative changes are also identified in the interphalangeal joint of the thumb. No acute fracture or subluxation. No radiopaque foreign body or soft tissue gas. No significant soft tissue swelling. IMPRESSION: Degenerative changes at the radiocarpal joint and interphalangeal joint of the thumb. No evidence for acute abnormality. Electronically Signed   By: Nolon Nations M.D.   On: 02/19/2019 13:04     Medications:   . sodium chloride Stopped (02/17/19 1747)  .  sodium bicarbonate (isotonic) infusion in sterile water 75 mL/hr at 02/18/19 1650   . aspirin EC  81 mg Oral Daily  . Chlorhexidine Gluconate Cloth  6 each Topical Daily  . finasteride  5 mg Oral Daily  . heparin   5,000 Units Subcutaneous Q8H  . insulin aspart  0-5 Units Subcutaneous QHS  . insulin aspart  0-9 Units Subcutaneous TID WC  . sodium chloride flush  3 mL Intravenous Once  . tamsulosin  0.4 mg Oral QPC supper   sodium chloride, acetaminophen **OR** acetaminophen, ondansetron **OR** ondansetron (ZOFRAN) IV, oxyCODONE  Assessment/ Plan:  73 y.o. male with a PMHx of anxiety, coronary artery disease, diabetes mellitus type 2, environmental allergies, hypertension, lymphedema, who was admitted to [facility] on 02/16/2019 for evaluation of back and abdominal pain for 1 week prior to admission.   1.  Acute kidney injury secondary to urinary obstruction.  No new renal function testing performed today.  Recommend continue monitoring of renal parameters as an outpatient.  2.  Bilateral hydronephrosis.  Secondary to prostatomegaly.  Foley catheter in place.  Additional urologic intervention as outpatient.  3.  Hypokalemia.  Potassium was up to 3.7 yesterday.  Continue to monitor this as an outpatient.  4.  Metabolic acidosis.  Serum bicarbonate was up to 21 yesterday.   LOS: 3 Jim Guerra 10/15/20202:15 PM

## 2019-02-26 ENCOUNTER — Other Ambulatory Visit: Payer: Self-pay

## 2019-02-26 ENCOUNTER — Ambulatory Visit: Payer: Medicare Other | Admitting: Physician Assistant

## 2019-02-26 ENCOUNTER — Encounter: Payer: Self-pay | Admitting: Physician Assistant

## 2019-02-26 ENCOUNTER — Ambulatory Visit (INDEPENDENT_AMBULATORY_CARE_PROVIDER_SITE_OTHER): Payer: Medicare Other | Admitting: Physician Assistant

## 2019-02-26 VITALS — BP 90/61 | HR 99 | Ht 68.0 in | Wt 191.0 lb

## 2019-02-26 DIAGNOSIS — L409 Psoriasis, unspecified: Secondary | ICD-10-CM | POA: Insufficient documentation

## 2019-02-26 DIAGNOSIS — F419 Anxiety disorder, unspecified: Secondary | ICD-10-CM | POA: Insufficient documentation

## 2019-02-26 DIAGNOSIS — R351 Nocturia: Secondary | ICD-10-CM | POA: Insufficient documentation

## 2019-02-26 DIAGNOSIS — N401 Enlarged prostate with lower urinary tract symptoms: Secondary | ICD-10-CM | POA: Insufficient documentation

## 2019-02-26 DIAGNOSIS — E785 Hyperlipidemia, unspecified: Secondary | ICD-10-CM | POA: Insufficient documentation

## 2019-02-26 DIAGNOSIS — R338 Other retention of urine: Secondary | ICD-10-CM

## 2019-02-26 DIAGNOSIS — I2510 Atherosclerotic heart disease of native coronary artery without angina pectoris: Secondary | ICD-10-CM | POA: Insufficient documentation

## 2019-02-26 LAB — BLADDER SCAN AMB NON-IMAGING

## 2019-02-26 NOTE — Progress Notes (Signed)
02/26/2019 4:29 PM   [place]. [birth year] [id number]  CC: Acute urinary retention  HPI: Jim Guerra. is a 73 y.o. male who presents today for catheter removal and voiding trial. He is an established [facility] patient who was hospitalized from October 12-15, 2020 with acute urinary retention with bilateral hydronephrosis and secondary AKI and with a new diagnosis of DM2.  He was discharged with instructions to continue finasteride and initiate Flomax.  Patient reports tolerating the catheter well. He denies gross hematuria or suprapubic pain.  He states that he has been compliant with Flomax and finasteride since discharge.  He was discharged to a rehab facility.  He is accompanied today by his sister-in-law, who contributed to HPI.  She reports that he was unable to void for 2 to 3 days prior to his last hospitalization before reporting this to anyone.  She is concerned that he may not seek timely care if he goes back into retention. Additionally, she notes the patient previously underwent a prostate "procedure" approximately 12 years ago. [facility] notes mention prostate biopsy in 2007 with findings of chronic inflammation.  Catheter Removal Patient is present today for a catheter removal.  60ml of water was drained from the balloon. A 16FR foley cath was removed from the bladder no complications were noted . Patient tolerated well. Performed by: Debroah Loop, PA-C Follow up/ Additional notes: Return this afternoon for PVR.  Afternoon PVR 170mL. Patient reports he has had water, tea, and Cheerwine to drink since leaving our office this morning.  He approximates his p.o. intake at 4 to 5 glasses total.  PMH: Past Medical History:  Diagnosis Date  . Anxiety   . Arthritis   . Coronary artery disease   . Diabetes mellitus without complication ([place])   . Elevated lipids   . Environmental allergies   . HOH (hard of hearing)   . Hypertension   . Lymphedema of both lower  extremities     Surgical History: Past Surgical History:  Procedure Laterality Date  . CATARACT EXTRACTION W/PHACO Right 08/04/2015   Procedure: CATARACT EXTRACTION PHACO AND INTRAOCULAR LENS PLACEMENT (IOC);  Surgeon: Birder Robson, MD;  Location: [facility] ORS;  Service: Ophthalmology;  Laterality: Right;  [place] 00:50 AP% 21.4 CDE 10.79 fluid pack lot # [id number]  . COLONOSCOPY    . FOOT SURGERY    . HERNIA REPAIR      Home Medications:  Allergies as of 02/26/2019   No Known Allergies     Medication List       Accurate as of February 26, 2019  4:29 PM. If you have any questions, ask your nurse or doctor.        aspirin EC 81 MG tablet Take 81 mg by mouth daily.   finasteride 5 MG tablet Commonly known as: PROSCAR Take 5 mg by mouth daily.   glyBURIDE 2.5 MG tablet Commonly known as: DIABETA Take 1 tablet (2.5 mg total) by mouth daily with breakfast.   tamsulosin 0.4 MG Caps capsule Commonly known as: FLOMAX Take 1 capsule (0.4 mg total) by mouth daily after supper.   valsartan-hydrochlorothiazide 320-25 MG tablet Commonly known as: DIOVAN-HCT Take 1 tablet by mouth daily.       Allergies:  No Known Allergies  Family History: Family History  Problem Relation Age of Onset  . CAD Mother   . COPD Mother     Social History:   reports that he has never smoked. He has never used  smokeless tobacco. He reports that he does not drink alcohol or use drugs.  ROS: UROLOGY Frequent Urination?: No Hard to postpone urination?: No Burning/pain with urination?: Yes Get up at night to urinate?: Yes Leakage of urine?: No Urine stream starts and stops?: Yes Trouble starting stream?: Yes Do you have to strain to urinate?: Yes Blood in urine?: No Urinary tract infection?: No Sexually transmitted disease?: No Injury to kidneys or bladder?: No Painful intercourse?: No Weak stream?: Yes Erection problems?: No Penile pain?: No  Gastrointestinal Nausea?: No  Vomiting?: No Indigestion/heartburn?: Yes Diarrhea?: No Constipation?: No  Constitutional Fever: No Night sweats?: No Weight loss?: No Fatigue?: No  Skin Skin rash/lesions?: Yes Itching?: Yes  Eyes Blurred vision?: No Double vision?: No  Ears/Nose/Throat Sore throat?: No Sinus problems?: Yes  Hematologic/Lymphatic Swollen glands?: No Easy bruising?: No  Cardiovascular Leg swelling?: No Chest pain?: No  Respiratory Cough?: No Shortness of breath?: No  Endocrine Excessive thirst?: Yes  Musculoskeletal Back pain?: Yes Joint pain?: Yes  Neurological Headaches?: No Dizziness?: No  Psychologic Depression?: No Anxiety?: Yes  Physical Exam: BP 90/61   Pulse 99   Ht 5\' 8"  (1.727 m)   Wt 191 lb (86.6 kg)   BMI 29.04 kg/m   Constitutional:  Alert, no acute distress, nontoxic appearing HEENT: [place], AT Cardiovascular: No clubbing, cyanosis, or edema Respiratory: Normal respiratory effort, no increased work of breathing GI: Abdomen is soft, nontender, nondistended, no abdominal masses GU: No CVA tenderness Lymph: No cervical or inguinal lymphadenopathy Skin: No rashes, bruises or suspicious lesions Neurologic: Grossly intact, no focal deficits, moving all 4 extremities Psychiatric: Anxious mood and affect  Laboratory Data: Results for orders placed or performed in visit on 02/26/19  BLADDER SCAN AMB NON-IMAGING  Result Value Ref Range   Scan Result 194mL    Pertinent Imaging: CLINICAL DATA:  Back pain, abdominal pain  EXAM: CT ABDOMEN AND PELVIS WITHOUT CONTRAST  TECHNIQUE: Multidetector CT imaging of the abdomen and pelvis was performed following the standard protocol without IV contrast.  COMPARISON:  Renal ultrasound 01/07/2009  FINDINGS: Lower chest: Small to moderate left-sided pleural effusion with associated left basilar atelectasis. Right lung bases clear.  Hepatobiliary: Liver is within normal limits. Multiple gallstones within  the gallbladder lumen. No pericholecystic inflammatory changes.  Pancreas: Fatty atrophy of the pancreas without ductal dilatation or surrounding inflammatory change.  Spleen: Within normal limits.  Adrenals/Urinary Tract: Bilateral adrenal glands unremarkable. Moderate to severe left and moderate right hydronephrosis. The bilateral ureters are mildly dilated. No renal or ureteral calculi. There is perinephric fluid and stranding, left worse than right. Urinary bladder is markedly distended.  Stomach/Bowel: Stomach is within normal limits. Appendix appears normal. Scattered colonic diverticulosis. No evidence of bowel wall thickening, distention, or inflammatory changes.  Vascular/Lymphatic: Aortic atherosclerosis. No enlarged abdominal or pelvic lymph nodes.  Reproductive: Prostatomegaly. A multilobulated prostate gland results in significant mass effect on the base of the urinary bladder. Prostate measures approximately 5.6 x 5.4 x 6.2 cm.  Other: Small fat containing umbilical hernia. Prominent fat contact within the bilateral inguinal canals, right greater than left. No pneumoperitoneum.  Musculoskeletal: No acute osseous findings.  IMPRESSION: 1. Moderate-to-severe left and moderate right hydroureteronephrosis with marked urinary bladder distention. There is prominent left greater than right perinephric fluid and stranding suggesting acute obstruction. Overall, these findings suggest bladder outlet obstruction secondary to marked prostatomegaly. 2. Small to moderate left-sided pleural effusion. 3. Cholelithiasis. 4. Diverticulosis without evidence of diverticulitis.   Electronically Signed  By: Duanne Guess M.D.   On: 02/16/2019 13:35  I personally reviewed the images referenced above and note the presence of a significantly enlarged, multilobular prostate.  Assessment & Plan:   1. Acute urinary retention Patient passed morning voiding trial with  afternoon PVR within normal limits.  I counseled patient and his sister-in-law that past episodes of urinary retention increase the risk of future urinary retention.  I explained that he is not currently retaining, however I cannot predict if or when this might happen again.    Prostate estimated at 98g per CT AP on 02/16/2019.  Patient is open to possible surgical procedures for BOO.  I counseled him to continue his daily tamsulosin and finasteride.  I advised him that if he becomes unable to urinate or develops lower abdominal or lumbar back pain, he needs to call the [facility] office urgently or proceed to the emergency department if outside of normal office hours.  He expressed understanding.  I reiterated this in a medical documentation form for his rehab facility. - BLADDER SCAN AMB NON-IMAGING  Return in 2 weeks (on 03/12/2019) for Cystoscopy with BOO surgical eval.  Carman Ching, PA-C  [facility] [address], [address] [place], [place] [address] [phone]

## 2019-02-27 ENCOUNTER — Encounter: Payer: Self-pay | Admitting: Physician Assistant

## 2019-02-27 ENCOUNTER — Telehealth: Payer: Self-pay | Admitting: Physician Assistant

## 2019-02-27 NOTE — Telephone Encounter (Signed)
I just received a phone call from patient's SNF.  Nursing reports patient is back in urinary retention with significant suprapubic and lumbar back pain.  I advised them to place an 42 [place] coud catheter for management of his acute urinary retention.  We will see him back in clinic on 11/12 for cystoscopy to evaluate him for surgical options for bladder outlet obstruction.  Per nursing staff request, I sent a letter reiterating the size of the requested catheter placement.  I advised him to follow-up with our clinic if they had any problems placing it.  They expressed understanding.

## 2019-03-09 ENCOUNTER — Ambulatory Visit (INDEPENDENT_AMBULATORY_CARE_PROVIDER_SITE_OTHER): Payer: Medicare Other | Admitting: Urology

## 2019-03-09 ENCOUNTER — Other Ambulatory Visit: Payer: Self-pay

## 2019-03-09 ENCOUNTER — Encounter: Payer: Self-pay | Admitting: Urology

## 2019-03-09 VITALS — BP 100/66 | HR 90 | Ht 68.0 in | Wt 191.0 lb

## 2019-03-09 DIAGNOSIS — N401 Enlarged prostate with lower urinary tract symptoms: Secondary | ICD-10-CM

## 2019-03-09 DIAGNOSIS — R338 Other retention of urine: Secondary | ICD-10-CM

## 2019-03-09 MED ORDER — LIDOCAINE HCL URETHRAL/MUCOSAL 2 % EX GEL
1.0000 "application " | Freq: Once | CUTANEOUS | Status: AC
  Administered 2019-03-09: 1 via URETHRAL

## 2019-03-09 NOTE — Progress Notes (Signed)
Catheter Removal  Patient is present today for a catheter removal.  3ml of water was drained from the balloon. A 16FR foley cath was removed from the bladder no complications were noted . Patient tolerated well.  Performed by: Gordy Clement, CMA (AAMA)  Follow up/ Additional notes: Cysto to follow cath removal    Simple Catheter Placement  Due to urinary retention patient is present today for a foley cath placement. Patient was cleaned and prepped in a sterile fashion with betadine and lidocaine jelly 2% was instilled into the urethra.  A 16FR Coude foley catheter was inserted, urine return was noted  79ml, urine was dark yellow in color. The balloon was filled with 10cc of sterile water.  A night bag was attached for drainage.  Patient was given instruction on proper catheter care. Patient tolerated well, no complications were noted.  Performed by: Gordy Clement, CMA (AAMA)  Additional notes/ Follow up: As Scheduled

## 2019-03-09 NOTE — Progress Notes (Signed)
   03/09/19  CC:  Chief Complaint  Patient presents with  . Cysto    HPI: History urinary retention with acute renal failure and bilateral hydronephrosis.  1300 mL urine obtained on catheterization.  Failed voiding trial x1  Blood pressure 100/66, pulse 90, height 5\' 8"  (1.727 m), weight 191 lb (86.6 kg). NED. A&Ox3.   No respiratory distress   Abd soft, NT, ND Normal phallus with bilateral descended testicles  Cystoscopy Procedure Note  Patient identification was confirmed, informed consent was obtained, and patient was prepped using Betadine solution.  Lidocaine jelly was administered per urethral meatus.     Pre-Procedure: - Inspection reveals a normal caliber urethral meatus.  Procedure: The flexible cystoscope was introduced without difficulty - No urethral strictures/lesions are present. - Coapting lateral lobes with occlusive median lobe  - Bilateral ureteral orifices identified - Bladder mucosa  reveals no ulcers, tumors, or lesions with the exception of inflammatory changes secondary to indwelling Foley - No bladder stones -Moderate trabeculation  Retroflexion shows intravesical median lobe   Post-Procedure: - Patient tolerated the procedure well  Assessment/ Plan: 73 year old male with urinary retention.  He has failed voiding trial x1.  He declined a repeat voiding trial and is interested in surgical management.  Feel his best treatment based on prostate volume would be Holep.  Will discuss with Dr. Erlene Quan.   [name], MD

## 2019-03-10 ENCOUNTER — Encounter: Payer: Self-pay | Admitting: Urology

## 2019-03-13 ENCOUNTER — Ambulatory Visit (INDEPENDENT_AMBULATORY_CARE_PROVIDER_SITE_OTHER): Payer: Medicare Other | Admitting: Urology

## 2019-03-13 ENCOUNTER — Encounter: Payer: Self-pay | Admitting: Urology

## 2019-03-13 ENCOUNTER — Other Ambulatory Visit: Payer: Self-pay

## 2019-03-13 VITALS — BP 118/98 | HR 94 | Ht 68.0 in | Wt 194.0 lb

## 2019-03-13 DIAGNOSIS — R338 Other retention of urine: Secondary | ICD-10-CM | POA: Diagnosis not present

## 2019-03-13 DIAGNOSIS — N401 Enlarged prostate with lower urinary tract symptoms: Secondary | ICD-10-CM | POA: Diagnosis not present

## 2019-03-13 NOTE — H&P (View-Only) (Signed)
03/13/2019 3:03 PM   Jim Guerra. Apr 17, 1946 [id number]  Referring provider: Marguarite Arbour, MD [address] [facility] [place],  [place] [address]  Chief Complaint  Patient presents with  . Benign Prostatic Hypertrophy    HPI: 73 year old male with BPH who developed urinary retention now status post multiple numerous failed voiding trials who presents today to discuss possibility of holmium laser enucleation of the prostate.  He previously underwent evaluation with Dr. Lonna Cobb including cystoscopy which revealed coapting lateral lobes of the occlusive median lobe and moderate trabeculation.  On imaging, he has significant prostamegaly with at least 98 grams cc prostate.  Currently on maximal medical therapy.  He is accompanied today by his sister-in-law.  He has been at rehab since his recent hospitalization but is now appropriate for discharge home with a Foley catheter.   PMH: Past Medical History:  Diagnosis Date  . Anxiety   . Arthritis   . Coronary artery disease   . Diabetes mellitus without complication ([facility])   . Elevated lipids   . Environmental allergies   . HOH (hard of hearing)   . Hypertension   . Lymphedema of both lower extremities     Surgical History: Past Surgical History:  Procedure Laterality Date  . CATARACT EXTRACTION W/PHACO Right 08/04/2015   Procedure: CATARACT EXTRACTION PHACO AND INTRAOCULAR LENS PLACEMENT (IOC);  Surgeon: Galen Manila, MD;  Location: [facility] ORS;  Service: Ophthalmology;  Laterality: Right;  [place] 00:50 AP% 21.4 CDE 10.79 fluid pack lot # [id number]  . COLONOSCOPY    . FOOT SURGERY    . HERNIA REPAIR      Home Medications:  Allergies as of 03/13/2019   No Known Allergies     Medication List       Accurate as of March 13, 2019  3:03 PM. If you have any questions, ask your nurse or doctor.        aspirin EC 81 MG tablet Take 81 mg by mouth daily.   finasteride 5 MG tablet Commonly  known as: PROSCAR Take 5 mg by mouth daily.   glyBURIDE 2.5 MG tablet Commonly known as: DIABETA Take 1 tablet (2.5 mg total) by mouth daily with breakfast.   tamsulosin 0.4 MG Caps capsule Commonly known as: FLOMAX Take 1 capsule (0.4 mg total) by mouth daily after supper.   valsartan-hydrochlorothiazide 320-25 MG tablet Commonly known as: DIOVAN-HCT Take 1 tablet by mouth daily. What changed: Another medication with the same name was removed. Continue taking this medication, and follow the directions you see here. Changed by: Vanna Scotland, MD       Allergies: No Known Allergies  Family History: Family History  Problem Relation Age of Onset  . CAD Mother   . COPD Mother     Social History:  reports that he has never smoked. He has never used smokeless tobacco. He reports that he does not drink alcohol or use drugs.  ROS: UROLOGY Frequent Urination?: No Hard to postpone urination?: No Burning/pain with urination?: Yes Get up at night to urinate?: Yes Leakage of urine?: No Urine stream starts and stops?: Yes Trouble starting stream?: Yes Do you have to strain to urinate?: Yes Blood in urine?: No Urinary tract infection?: No Sexually transmitted disease?: No Injury to kidneys or bladder?: No Painful intercourse?: No Weak stream?: No Erection problems?: No Penile pain?: No  Gastrointestinal Nausea?: No Vomiting?: No Indigestion/heartburn?: No Diarrhea?: No Constipation?: No  Constitutional Fever: No Night sweats?: No  Weight loss?: No Fatigue?: No  Skin Skin rash/lesions?: Yes Itching?: Yes  Eyes Blurred vision?: No Double vision?: No  Ears/Nose/Throat Sore throat?: No Sinus problems?: No  Hematologic/Lymphatic Swollen glands?: No Easy bruising?: No  Cardiovascular Leg swelling?: No Chest pain?: No  Respiratory Cough?: No Shortness of breath?: No  Endocrine Excessive thirst?: Yes  Musculoskeletal Back pain?: Yes Joint pain?:  Yes  Neurological Headaches?: No Dizziness?: No  Psychologic Depression?: No Anxiety?: Yes  Physical Exam: BP (!) 118/98   Pulse 94   Ht 5\' 8"  (1.727 m)   Wt 194 lb (88 kg)   BMI 29.50 kg/m   Constitutional:  Alert and oriented, No acute distress. HEENT: [place] AT, moist mucus membranes.  Trachea midline, no masses. Cardiovascular: No clubbing, cyanosis, or edema. Respiratory: Normal respiratory effort, no increased work of breathing. GI: Abdomen is soft, nontender, nondistended, no abdominal masses GU: Foley in place string clearly urine Skin: No rashes, bruises or suspicious lesions. Neurologic: Grossly intact, no focal deficits, moving all 4 extremities. Psychiatric: Normal mood and affect.  Laboratory Data: Lab Results  Component Value Date   WBC 10.2 02/18/2019   HGB 12.6 (L) 02/18/2019   HCT 37.2 (L) 02/18/2019   MCV 87.5 02/18/2019   PLT 286 02/18/2019    Lab Results  Component Value Date   CREATININE 1.43 (H) 02/18/2019    Lab Results  Component Value Date   HGBA1C 7.0 (H) 02/16/2019    Urinalysis    Component Value Date/Time   COLORURINE YELLOW (A) 02/16/2019 1129   APPEARANCEUR CLEAR (A) 02/16/2019 1129   LABSPEC 1.012 02/16/2019 1129   PHURINE 5.0 02/16/2019 1129   GLUCOSEU NEGATIVE 02/16/2019 1129   HGBUR NEGATIVE 02/16/2019 1129   BILIRUBINUR NEGATIVE 02/16/2019 [place] 02/16/2019 1129   PROTEINUR NEGATIVE 02/16/2019 1129   NITRITE NEGATIVE 02/16/2019 1129   LEUKOCYTESUR NEGATIVE 02/16/2019 1129    Lab Results  Component Value Date   BACTERIA NONE SEEN 02/16/2019    Pertinent Imaging: CT scan 02/16/2019 personally reviewed  Assessment & Plan:    1. Benign prostatic hyperplasia with urinary retention Massive prostamegaly with trilobar coaptation, median lobe and trabeculated bladder and refractory urinary retention despite maximal medical therapy  We discussed options including urodynamics versus going ahead and  proceeding with outlet procedure.  Based on the presence of fairly significant prostamegaly and a median lobe, highly suspicious that his ongoing retention is related to obstruction rather than decompensation of his bladder.  Both he and his sister-in-law do understand that there is a risk of ongoing retention after an outlet procedure.  Given the size of his gland, would strongly recommend consideration of holep.  TURP is an option however there would be increased risk of bleeding and require overnight stay for CBI.  We discussed alternatives including TURP vs. holmium laser enucleation of the prostate vs. greenlight laser ablation. Differences between the surgical procedures were discussed as well as the risks and benefits of each. He is most interested in HoLEP.  We discussed the common postoperative course following HoLEP including need for placement Foley catheter, temporary worsening of irritative voiding symptoms, and occasional stress incontinence which typically lasts up to 6 months but can persist. We discussed retrograde ejaculation and damage to surrounding structures including the urinary sphincter. Other uncommon complications including hematuria and urinary tract infection.  He understands all of the above and is willing to proceed as planned.   Hollice Espy, MD  [facility] [address]  [address], [place], [place] [address] [phone]

## 2019-03-13 NOTE — Progress Notes (Signed)
 03/13/2019 3:03 PM   Jim E Akhtar Jr. 04/27/1946 [id number]  Referring provider: Sparks, Jeffrey D, MD [address] [facility] [place],  [place] [address]  Chief Complaint  Patient presents with  . Benign Prostatic Hypertrophy    HPI: 73-year-old male with BPH who developed urinary retention now status post multiple numerous failed voiding trials who presents today to discuss possibility of holmium laser enucleation of the prostate.  He previously underwent evaluation with Dr. Stoioff including cystoscopy which revealed coapting lateral lobes of the occlusive median lobe and moderate trabeculation.  On imaging, he has significant prostamegaly with at least 98 grams cc prostate.  Currently on maximal medical therapy.  He is accompanied today by his sister-in-law.  He has been at rehab since his recent hospitalization but is now appropriate for discharge home with a Foley catheter.   PMH: Past Medical History:  Diagnosis Date  . Anxiety   . Arthritis   . Coronary artery disease   . Diabetes mellitus without complication (HCC)   . Elevated lipids   . Environmental allergies   . HOH (hard of hearing)   . Hypertension   . Lymphedema of both lower extremities     Surgical History: Past Surgical History:  Procedure Laterality Date  . CATARACT EXTRACTION W/PHACO Right 08/04/2015   Procedure: CATARACT EXTRACTION PHACO AND INTRAOCULAR LENS PLACEMENT (IOC);  Surgeon: William Porfilio, MD;  Location: [facility] ORS;  Service: Ophthalmology;  Laterality: Right;  US 00:50 AP% 21.4 CDE 10.79 fluid pack lot # [id number]  . COLONOSCOPY    . FOOT SURGERY    . HERNIA REPAIR      Home Medications:  Allergies as of 03/13/2019   No Known Allergies     Medication List       Accurate as of March 13, 2019  3:03 PM. If you have any questions, ask your nurse or doctor.        aspirin EC 81 MG tablet Take 81 mg by mouth daily.   finasteride 5 MG tablet Commonly  known as: PROSCAR Take 5 mg by mouth daily.   glyBURIDE 2.5 MG tablet Commonly known as: DIABETA Take 1 tablet (2.5 mg total) by mouth daily with breakfast.   tamsulosin 0.4 MG Caps capsule Commonly known as: FLOMAX Take 1 capsule (0.4 mg total) by mouth daily after supper.   valsartan-hydrochlorothiazide 320-25 MG tablet Commonly known as: DIOVAN-HCT Take 1 tablet by mouth daily. What changed: Another medication with the same name was removed. Continue taking this medication, and follow the directions you see here. Changed by: Harlow Carrizales, MD       Allergies: No Known Allergies  Family History: Family History  Problem Relation Age of Onset  . CAD Mother   . COPD Mother     Social History:  reports that he has never smoked. He has never used smokeless tobacco. He reports that he does not drink alcohol or use drugs.  ROS: UROLOGY Frequent Urination?: No Hard to postpone urination?: No Burning/pain with urination?: Yes Get up at night to urinate?: Yes Leakage of urine?: No Urine stream starts and stops?: Yes Trouble starting stream?: Yes Do you have to strain to urinate?: Yes Blood in urine?: No Urinary tract infection?: No Sexually transmitted disease?: No Injury to kidneys or bladder?: No Painful intercourse?: No Weak stream?: No Erection problems?: No Penile pain?: No  Gastrointestinal Nausea?: No Vomiting?: No Indigestion/heartburn?: No Diarrhea?: No Constipation?: No  Constitutional Fever: No Night sweats?: No   Weight loss?: No Fatigue?: No  Skin Skin rash/lesions?: Yes Itching?: Yes  Eyes Blurred vision?: No Double vision?: No  Ears/Nose/Throat Sore throat?: No Sinus problems?: No  Hematologic/Lymphatic Swollen glands?: No Easy bruising?: No  Cardiovascular Leg swelling?: No Chest pain?: No  Respiratory Cough?: No Shortness of breath?: No  Endocrine Excessive thirst?: Yes  Musculoskeletal Back pain?: Yes Joint pain?:  Yes  Neurological Headaches?: No Dizziness?: No  Psychologic Depression?: No Anxiety?: Yes  Physical Exam: BP (!) 118/98   Pulse 94   Ht 5\' 8"  (1.727 m)   Wt 194 lb (88 kg)   BMI 29.50 kg/m   Constitutional:  Alert and oriented, No acute distress. HEENT: Crum AT, moist mucus membranes.  Trachea midline, no masses. Cardiovascular: No clubbing, cyanosis, or edema. Respiratory: Normal respiratory effort, no increased work of breathing. GI: Abdomen is soft, nontender, nondistended, no abdominal masses GU: Foley in place string clearly urine Skin: No rashes, bruises or suspicious lesions. Neurologic: Grossly intact, no focal deficits, moving all 4 extremities. Psychiatric: Normal mood and affect.  Laboratory Data: Lab Results  Component Value Date   WBC 10.2 02/18/2019   HGB 12.6 (L) 02/18/2019   HCT 37.2 (L) 02/18/2019   MCV 87.5 02/18/2019   PLT 286 02/18/2019    Lab Results  Component Value Date   CREATININE 1.43 (H) 02/18/2019    Lab Results  Component Value Date   HGBA1C 7.0 (H) 02/16/2019    Urinalysis    Component Value Date/Time   COLORURINE YELLOW (A) 02/16/2019 1129   APPEARANCEUR CLEAR (A) 02/16/2019 1129   LABSPEC 1.012 02/16/2019 1129   PHURINE 5.0 02/16/2019 1129   GLUCOSEU NEGATIVE 02/16/2019 1129   HGBUR NEGATIVE 02/16/2019 1129   BILIRUBINUR NEGATIVE 02/16/2019 [place] 02/16/2019 1129   PROTEINUR NEGATIVE 02/16/2019 1129   NITRITE NEGATIVE 02/16/2019 1129   LEUKOCYTESUR NEGATIVE 02/16/2019 1129    Lab Results  Component Value Date   BACTERIA NONE SEEN 02/16/2019    Pertinent Imaging: CT scan 02/16/2019 personally reviewed  Assessment & Plan:    1. Benign prostatic hyperplasia with urinary retention Massive prostamegaly with trilobar coaptation, median lobe and trabeculated bladder and refractory urinary retention despite maximal medical therapy  We discussed options including urodynamics versus going ahead and  proceeding with outlet procedure.  Based on the presence of fairly significant prostamegaly and a median lobe, highly suspicious that his ongoing retention is related to obstruction rather than decompensation of his bladder.  Both he and his sister-in-law do understand that there is a risk of ongoing retention after an outlet procedure.  Given the size of his gland, would strongly recommend consideration of holep.  TURP is an option however there would be increased risk of bleeding and require overnight stay for CBI.  We discussed alternatives including TURP vs. holmium laser enucleation of the prostate vs. greenlight laser ablation. Differences between the surgical procedures were discussed as well as the risks and benefits of each. He is most interested in HoLEP.  We discussed the common postoperative course following HoLEP including need for placement Foley catheter, temporary worsening of irritative voiding symptoms, and occasional stress incontinence which typically lasts up to 6 months but can persist. We discussed retrograde ejaculation and damage to surrounding structures including the urinary sphincter. Other uncommon complications including hematuria and urinary tract infection.  He understands all of the above and is willing to proceed as planned.   Hollice Espy, MD  [facility] [address]  [address], [place], [place] [address] [phone]

## 2019-03-13 NOTE — Patient Instructions (Signed)

## 2019-03-17 ENCOUNTER — Other Ambulatory Visit (INDEPENDENT_AMBULATORY_CARE_PROVIDER_SITE_OTHER): Payer: Self-pay | Admitting: Nurse Practitioner

## 2019-03-19 ENCOUNTER — Other Ambulatory Visit: Payer: Medicare Other | Admitting: Urology

## 2019-03-19 ENCOUNTER — Other Ambulatory Visit: Payer: Self-pay

## 2019-03-19 ENCOUNTER — Ambulatory Visit (INDEPENDENT_AMBULATORY_CARE_PROVIDER_SITE_OTHER): Payer: Medicare Other | Admitting: Physician Assistant

## 2019-03-19 ENCOUNTER — Encounter: Payer: Self-pay | Admitting: Physician Assistant

## 2019-03-19 VITALS — BP 118/58 | HR 60 | Ht 68.0 in | Wt 199.0 lb

## 2019-03-19 DIAGNOSIS — N401 Enlarged prostate with lower urinary tract symptoms: Secondary | ICD-10-CM

## 2019-03-19 DIAGNOSIS — R338 Other retention of urine: Secondary | ICD-10-CM

## 2019-03-19 DIAGNOSIS — Z01812 Encounter for preprocedural laboratory examination: Secondary | ICD-10-CM

## 2019-03-19 LAB — URINALYSIS, COMPLETE
Bilirubin, UA: NEGATIVE
Glucose, UA: NEGATIVE
Nitrite, UA: NEGATIVE
Specific Gravity, UA: 1.025 (ref 1.005–1.030)
Urobilinogen, Ur: 0.2 mg/dL (ref 0.2–1.0)
pH, UA: 5 (ref 5.0–7.5)

## 2019-03-19 LAB — MICROSCOPIC EXAMINATION: RBC, Urine: >30 /hpf — AB (ref 0–2)

## 2019-03-19 NOTE — Progress Notes (Signed)
Urine sample from plugged catheter sent to lab.  Questions about surgery answered.

## 2019-03-24 ENCOUNTER — Other Ambulatory Visit: Payer: Self-pay | Admitting: Physician Assistant

## 2019-03-24 DIAGNOSIS — R8279 Other abnormal findings on microbiological examination of urine: Secondary | ICD-10-CM

## 2019-03-24 LAB — CULTURE, URINE COMPREHENSIVE

## 2019-03-24 MED ORDER — AMOXICILLIN-POT CLAVULANATE 875-125 MG PO TABS: 1.0000 | ORAL_TABLET | Freq: Two times a day (BID) | ORAL | 0 refills | Status: AC

## 2019-03-24 NOTE — Progress Notes (Signed)
Patient's preop urine culture resulted with tetracycline-resistant E faecalis.  Prescription for Augmentin twice daily x7 days sent to his pharmacy.  I called and spoke with his sister-in-law over the phone.  I explained that his indwelling Foley catheter increases the chance of asymptomatic bacteriuria, and that his positive urine culture does not necessarily indicate infection, however treatment is indicated regardless given his impending surgical procedure.  She expressed understanding and states patient will start this medication tomorrow.

## 2019-03-25 ENCOUNTER — Other Ambulatory Visit: Payer: Self-pay | Admitting: Radiology

## 2019-03-25 DIAGNOSIS — N401 Enlarged prostate with lower urinary tract symptoms: Secondary | ICD-10-CM

## 2019-03-26 ENCOUNTER — Encounter
Admission: RE | Admit: 2019-03-26 | Discharge: 2019-03-26 | Disposition: A | Discharge status: Home / Self Care | Payer: Medicare Other | Source: Ambulatory Visit | Attending: Urology | Admitting: Urology

## 2019-03-26 ENCOUNTER — Other Ambulatory Visit: Payer: Self-pay

## 2019-03-26 DIAGNOSIS — Z01812 Encounter for preprocedural laboratory examination: Secondary | ICD-10-CM | POA: Insufficient documentation

## 2019-03-26 DIAGNOSIS — Z20828 Contact with and (suspected) exposure to other viral communicable diseases: Secondary | ICD-10-CM | POA: Insufficient documentation

## 2019-03-26 HISTORY — DX: Cardiac arrhythmia, unspecified: I49.9

## 2019-03-26 LAB — SARS CORONAVIRUS 2 (TAT 6-24 HRS): SARS Coronavirus 2: NEGATIVE

## 2019-03-26 NOTE — Patient Instructions (Signed)
Your procedure is scheduled on: Mon 11/23 Report to Day Surgery. To find out your arrival time please call [phone] between 1PM - 3PM on Frid. 11/20.  Remember: Instructions that are not followed completely may result in serious medical risk,  up to and including death, or upon the discretion of your surgeon and anesthesiologist your  surgery may need to be rescheduled.     _X__ 1. Do not eat food after midnight the night before your procedure.                 No gum chewing or hard candies. You may drink clear liquids up to 2 hours                 before you are scheduled to arrive for your surgery- DO not drink clear                 liquids within 2 hours of the start of your surgery.                 Clear Liquids include:  water, apple juice without pulp, clear carbohydrate                 drink such as Clearfast of Gatorade, Black Coffee or Tea (Do not add                 anything to coffee or tea).  __X__2.  On the morning of surgery brush your teeth with toothpaste and water, you                may rinse your mouth with mouthwash if you wish.  Do not swallow any toothpaste of mouthwash.     ___ 3.  No Alcohol for 24 hours before or after surgery.   ___ 4.  Do Not Smoke or use e-cigarettes For 24 Hours Prior to Your Surgery.                 Do not use any chewable tobacco products for at least 6 hours prior to                 surgery.  ____  5.  Bring all medications with you on the day of surgery if instructed.   ___x_  6.  Notify your doctor if there is any change in your medical condition      (cold, fever, infections).     Do not wear jewelry, make-up, hairpins, clips or nail polish. Do not wear lotions, powders, or perfumes. You may wear deodorant. Do not shave 48 hours prior to surgery. Men may shave face and neck. Do not bring valuables to the hospital.    [facility] is not responsible for any belongings or valuables.  Contacts, dentures  or bridgework may not be worn into surgery. Leave your suitcase in the car. After surgery it may be brought to your room. For patients admitted to the hospital, discharge time is determined by your treatment team.   Patients discharged the day of surgery will not be allowed to drive home.   Please read over the following fact sheets that you were given:    _x___ Take these medicines the morning of surgery with A SIP OF WATER:    1.ALPRAZolam (XANAX) 0.25 MG tablet   2. finasteride (PROSCAR) 5 MG tablet  3.   4.  5.  6.  ____ Fleet Enema (as directed)   ____ Use CHG Soap as directed  ____ Use inhalers on the day of surgery  ____ Stop metformin 2 days prior to surgery    ____ Take 1/2 of usual insulin dose the night before surgery. No insulin the morning          of surgery.   __x__ Stopped aspirin  11/17  ____ Stop Anti-inflammatories on    ____ Stop supplements until after surgery.    ____ Bring C-Pap to the hospital.

## 2019-03-30 ENCOUNTER — Encounter: Payer: Self-pay | Admitting: *Deleted

## 2019-03-30 ENCOUNTER — Ambulatory Visit
Admission: RE | Admit: 2019-03-30 | Discharge: 2019-03-30 | Disposition: A | Discharge status: Home / Self Care | Payer: Medicare Other | Source: Ambulatory Visit | Attending: Urology | Admitting: Urology

## 2019-03-30 ENCOUNTER — Ambulatory Visit: Payer: Medicare Other | Admitting: Certified Registered Nurse Anesthetist

## 2019-03-30 ENCOUNTER — Other Ambulatory Visit: Payer: Self-pay

## 2019-03-30 ENCOUNTER — Encounter
Admission: RE | Disposition: A | Discharge status: Home / Self Care | Payer: Self-pay | Source: Ambulatory Visit | Attending: Urology

## 2019-03-30 DIAGNOSIS — I2510 Atherosclerotic heart disease of native coronary artery without angina pectoris: Secondary | ICD-10-CM | POA: Insufficient documentation

## 2019-03-30 DIAGNOSIS — N3289 Other specified disorders of bladder: Secondary | ICD-10-CM | POA: Diagnosis not present

## 2019-03-30 DIAGNOSIS — N401 Enlarged prostate with lower urinary tract symptoms: Secondary | ICD-10-CM | POA: Diagnosis present

## 2019-03-30 DIAGNOSIS — I10 Essential (primary) hypertension: Secondary | ICD-10-CM | POA: Diagnosis not present

## 2019-03-30 DIAGNOSIS — Z7984 Long term (current) use of oral hypoglycemic drugs: Secondary | ICD-10-CM | POA: Insufficient documentation

## 2019-03-30 DIAGNOSIS — N320 Bladder-neck obstruction: Secondary | ICD-10-CM | POA: Insufficient documentation

## 2019-03-30 DIAGNOSIS — Z79899 Other long term (current) drug therapy: Secondary | ICD-10-CM | POA: Insufficient documentation

## 2019-03-30 DIAGNOSIS — Z9841 Cataract extraction status, right eye: Secondary | ICD-10-CM | POA: Insufficient documentation

## 2019-03-30 DIAGNOSIS — Z961 Presence of intraocular lens: Secondary | ICD-10-CM | POA: Diagnosis not present

## 2019-03-30 DIAGNOSIS — E119 Type 2 diabetes mellitus without complications: Secondary | ICD-10-CM | POA: Diagnosis not present

## 2019-03-30 DIAGNOSIS — R338 Other retention of urine: Secondary | ICD-10-CM | POA: Diagnosis not present

## 2019-03-30 DIAGNOSIS — M1990 Unspecified osteoarthritis, unspecified site: Secondary | ICD-10-CM | POA: Insufficient documentation

## 2019-03-30 HISTORY — PX: HOLEP-LASER ENUCLEATION OF THE PROSTATE WITH MORCELLATION: SHX6641

## 2019-03-30 LAB — GLUCOSE, CAPILLARY
Glucose-Capillary: 137 mg/dL — ABNORMAL HIGH (ref 70–99)
Glucose-Capillary: 130 mg/dL — ABNORMAL HIGH (ref 70–99)

## 2019-03-30 SURGERY — ENUCLEATION, PROSTATE, USING LASER, WITH MORCELLATION
Anesthesia: General | Site: Prostate

## 2019-03-30 MED ORDER — ACETAMINOPHEN 10 MG/ML IV SOLN
INTRAVENOUS | Status: DC | PRN
  Administered 2019-03-30: 1000 mg via INTRAVENOUS

## 2019-03-30 MED ORDER — FENTANYL CITRATE (PF) 100 MCG/2ML IJ SOLN
INTRAMUSCULAR | Status: AC
  Filled 2019-03-30: qty 2

## 2019-03-30 MED ORDER — FAMOTIDINE 20 MG PO TABS
20.0000 mg | ORAL_TABLET | Freq: Once | ORAL | Status: AC
  Administered 2019-03-30: 08:00:00 20 mg via ORAL

## 2019-03-30 MED ORDER — FUROSEMIDE 10 MG/ML IJ SOLN
INTRAMUSCULAR | Status: AC
  Filled 2019-03-30: qty 4

## 2019-03-30 MED ORDER — SUGAMMADEX SODIUM 200 MG/2ML IV SOLN
INTRAVENOUS | Status: AC
  Filled 2019-03-30: qty 2

## 2019-03-30 MED ORDER — SUGAMMADEX SODIUM 200 MG/2ML IV SOLN
INTRAVENOUS | Status: DC | PRN
  Administered 2019-03-30: 180.6 mg via INTRAVENOUS

## 2019-03-30 MED ORDER — PHENYLEPHRINE HCL (PRESSORS) 10 MG/ML IV SOLN
INTRAVENOUS | Status: DC | PRN
  Administered 2019-03-30 (×2): 100 ug via INTRAVENOUS

## 2019-03-30 MED ORDER — MIDAZOLAM HCL 2 MG/2ML IJ SOLN
INTRAMUSCULAR | Status: DC | PRN
  Administered 2019-03-30: 0.5 mg via INTRAVENOUS
  Administered 2019-03-30: 1.5 mg via INTRAVENOUS

## 2019-03-30 MED ORDER — ROCURONIUM BROMIDE 100 MG/10ML IV SOLN
INTRAVENOUS | Status: DC | PRN
  Administered 2019-03-30: 10 mg via INTRAVENOUS
  Administered 2019-03-30: 20 mg via INTRAVENOUS
  Administered 2019-03-30 (×2): 10 mg via INTRAVENOUS
  Administered 2019-03-30: 30 mg via INTRAVENOUS

## 2019-03-30 MED ORDER — FAMOTIDINE 20 MG PO TABS
ORAL_TABLET | ORAL | Status: AC
  Administered 2019-03-30: 20 mg via ORAL
  Filled 2019-03-30: qty 1

## 2019-03-30 MED ORDER — CEFAZOLIN SODIUM-DEXTROSE 2-4 GM/100ML-% IV SOLN
INTRAVENOUS | Status: AC
  Filled 2019-03-30: qty 100

## 2019-03-30 MED ORDER — CEFAZOLIN SODIUM-DEXTROSE 2-4 GM/100ML-% IV SOLN
2.0000 g | INTRAVENOUS | Status: AC
  Administered 2019-03-30: 2 g via INTRAVENOUS

## 2019-03-30 MED ORDER — ONDANSETRON HCL 4 MG/2ML IJ SOLN
INTRAMUSCULAR | Status: DC | PRN
  Administered 2019-03-30: 4 mg via INTRAVENOUS

## 2019-03-30 MED ORDER — SODIUM CHLORIDE 0.9 % IV SOLN
INTRAVENOUS | Status: DC
  Administered 2019-03-30: 08:00:00 via INTRAVENOUS

## 2019-03-30 MED ORDER — PROPOFOL 10 MG/ML IV BOLUS
INTRAVENOUS | Status: DC | PRN
  Administered 2019-03-30: 120 mg via INTRAVENOUS

## 2019-03-30 MED ORDER — MIDAZOLAM HCL 2 MG/2ML IJ SOLN
INTRAMUSCULAR | Status: AC
  Filled 2019-03-30: qty 2

## 2019-03-30 MED ORDER — LIDOCAINE HCL (PF) 2 % IJ SOLN
INTRAMUSCULAR | Status: AC
  Filled 2019-03-30: qty 10

## 2019-03-30 MED ORDER — ACETAMINOPHEN 10 MG/ML IV SOLN
INTRAVENOUS | Status: AC
  Filled 2019-03-30: qty 100

## 2019-03-30 MED ORDER — OXYCODONE HCL 5 MG/5ML PO SOLN: 5.0000 mg | Freq: Once | ORAL | Status: DC | PRN

## 2019-03-30 MED ORDER — SUCCINYLCHOLINE CHLORIDE 20 MG/ML IJ SOLN
INTRAMUSCULAR | Status: DC | PRN
  Administered 2019-03-30: 120 mg via INTRAVENOUS

## 2019-03-30 MED ORDER — DEXAMETHASONE SODIUM PHOSPHATE 4 MG/ML IJ SOLN
INTRAMUSCULAR | Status: AC
  Filled 2019-03-30: qty 1

## 2019-03-30 MED ORDER — OXYCODONE HCL 5 MG PO TABS: 5.0000 mg | ORAL_TABLET | Freq: Once | ORAL | Status: DC | PRN

## 2019-03-30 MED ORDER — FUROSEMIDE 10 MG/ML IJ SOLN
INTRAMUSCULAR | Status: DC | PRN
  Administered 2019-03-30: 10 mg via INTRAMUSCULAR

## 2019-03-30 MED ORDER — FENTANYL CITRATE (PF) 100 MCG/2ML IJ SOLN
INTRAMUSCULAR | Status: DC | PRN
  Administered 2019-03-30 (×2): 50 ug via INTRAVENOUS

## 2019-03-30 MED ORDER — ONDANSETRON HCL 4 MG/2ML IJ SOLN
INTRAMUSCULAR | Status: AC
  Filled 2019-03-30: qty 2

## 2019-03-30 MED ORDER — DEXAMETHASONE SODIUM PHOSPHATE 10 MG/ML IJ SOLN
INTRAMUSCULAR | Status: DC | PRN
  Administered 2019-03-30: 5 mg via INTRAVENOUS

## 2019-03-30 MED ORDER — ROCURONIUM BROMIDE 50 MG/5ML IV SOLN
INTRAVENOUS | Status: AC
  Filled 2019-03-30: qty 1

## 2019-03-30 MED ORDER — PROPOFOL 10 MG/ML IV BOLUS
INTRAVENOUS | Status: AC
  Filled 2019-03-30: qty 20

## 2019-03-30 MED ORDER — LIDOCAINE HCL (CARDIAC) PF 100 MG/5ML IV SOSY
PREFILLED_SYRINGE | INTRAVENOUS | Status: DC | PRN
  Administered 2019-03-30: 80 mg via INTRAVENOUS

## 2019-03-30 MED ORDER — FENTANYL CITRATE (PF) 100 MCG/2ML IJ SOLN
25.0000 ug | INTRAMUSCULAR | Status: DC | PRN
  Administered 2019-03-30 (×4): 25 ug via INTRAVENOUS

## 2019-03-30 SURGICAL SUPPLY — 31 items
ADAPTER IRRIG TUBE 2 SPIKE SOL (ADAPTER) ×4 IMPLANT
BAG URINE DRAIN 2000ML AR STRL (UROLOGICAL SUPPLIES) IMPLANT
BAG URO DRAIN 4000ML (MISCELLANEOUS) IMPLANT
CATH FOL 2WAY LX 20X30 (CATHETERS) IMPLANT
CATH FOL 2WAY LX 22X30 (CATHETERS) IMPLANT
CATH FOLEY 3WAY 30CC 22FR (CATHETERS) IMPLANT
CATH URETL 5X70 OPEN END (CATHETERS) ×2 IMPLANT
CONTAINER COLLECT MORCELLATR (MISCELLANEOUS) ×1 IMPLANT
COVER WAND RF STERILE (DRAPES) ×2 IMPLANT
DRAPE 3/4 80X56 (DRAPES) ×2 IMPLANT
DRAPE UTILITY 15X26 TOWEL STRL (DRAPES) IMPLANT
FILTER OVERFLOW MORCELLATOR (FILTER) ×1 IMPLANT
GLOVE BIO SURGEON STRL SZ 6.5 (GLOVE) ×4 IMPLANT
GOWN STRL REUS W/ TWL LRG LVL3 (GOWN DISPOSABLE) ×2 IMPLANT
GOWN STRL REUS W/TWL LRG LVL3 (GOWN DISPOSABLE) ×2
GUIDEWIRE STR DUAL SENSOR (WIRE) IMPLANT
HOLDER FOLEY CATH W/STRAP (MISCELLANEOUS) ×2 IMPLANT
KIT TURNOVER CYSTO (KITS) ×2 IMPLANT
LASER FIBER 550M SMARTSCOPE (Laser) ×2 IMPLANT
MORCELLATOR COLLECT CONTAINER (MISCELLANEOUS) ×2
MORCELLATOR OVERFLOW FILTER (FILTER) ×2
MORCELLATOR ROTATION 4.75 335 (MISCELLANEOUS) ×2 IMPLANT
PACK CYSTO AR (MISCELLANEOUS) ×2 IMPLANT
SET CYSTO W/LG BORE CLAMP LF (SET/KITS/TRAYS/PACK) ×1 IMPLANT
SET IRRIG Y TYPE TUR BLADDER L (SET/KITS/TRAYS/PACK) ×2 IMPLANT
SLEEVE PROTECTION STRL DISP (MISCELLANEOUS) ×4 IMPLANT
SOL .9 NS 3000ML IRR  AL (IV SOLUTION) ×14
SOL .9 NS 3000ML IRR UROMATIC (IV SOLUTION) ×4 IMPLANT
SYRINGE IRR TOOMEY STRL 70CC (SYRINGE) ×2 IMPLANT
TUBE PUMP MORCELLATOR PIRANHA (TUBING) ×2 IMPLANT
WATER STERILE IRR 1000ML POUR (IV SOLUTION) ×2 IMPLANT

## 2019-03-30 NOTE — Anesthesia Postprocedure Evaluation (Signed)
Anesthesia Post Note  Patient: Jim Guerra.  Procedure(s) Performed: HOLEP-LASER ENUCLEATION OF THE PROSTATE WITH MORCELLATION (N/A Prostate)  Patient location during evaluation: PACU Anesthesia Type: General Level of consciousness: awake and alert Pain management: pain level controlled Vital Signs Assessment: post-procedure vital signs reviewed and stable Respiratory status: spontaneous breathing, nonlabored ventilation and respiratory function stable Cardiovascular status: blood pressure returned to baseline and stable Postop Assessment: no apparent nausea or vomiting Anesthetic complications: no     Last Vitals:  Vitals:   03/30/19 1225 03/30/19 1234  BP:  133/66  Pulse: 69 67  Resp: 15 16  Temp:  (!) 36.2 C  SpO2: 94% 98%    Last Pain:  Vitals:   03/30/19 1234  TempSrc: Temporal  PainSc: 0-No pain                 Durenda Hurt

## 2019-03-30 NOTE — Op Note (Signed)
Date of procedure: 03/30/19  Preoperative diagnosis:  1. BPH with BOO  Postoperative diagnosis:  1. same   Procedure: 1. HoLEP with morcellation  Surgeon: Hollice Espy, MD  Anesthesia: General  Complications: None  Intraoperative findings: Significant trilobar coaptation with median lobe component.  Bladder trabeculation.  EBL: Minimal  Specimens: Prostate chips  Drains: 29 French two-way Foley catheter with 30 cc balloon  Indication: Jim Guerra. is a 73 y.o. patient with massive BPH, evidence of bladder outlet obstruction in urinary retention status post multiple failed voiding trials on maximal medical therapy.  After reviewing the management options for treatment, he elected to proceed with the above surgical procedure(s). We have discussed the potential benefits and risks of the procedure, side effects of the proposed treatment, the likelihood of the patient achieving the goals of the procedure, and any potential problems that might occur during the procedure or recuperation. Informed consent has been obtained.  Description of procedure:  The patient was taken to the operating room and general anesthesia was induced.  The patient was placed in the dorsal lithotomy position, prepped and draped in the usual sterile fashion, and preoperative antibiotics were administered. A preoperative time-out was performed.     A 26 French resectoscope sheath using a blunt angled obturator was introduced without difficulty into the bladder.  The bladder was carefully inspected and noted to be moderately trabeculated.  There is an elevated bladder neck with a  notable median lobe the left side of which was somewhat fused with the left lateral lobe.  The trigone was able to be visualized with some manipulation and the UOs were a good distance from bladder neck itself.  The prostatic fossa had significant trilobar coaptation with greater than 5 cm prostatic length.  A 550 m laser fiber was  then brought in and using settings of 0.9 J's and 53 Hz, 2 incisions were created at the 5:00 and 7:00 positions of the bladder neck on either side of the median lobe down to the level of the bladder neck/capsular fibers.  The incision was carried down caudally meeting in the midline just above the verumontanum.  The median lobe was then enucleated from a caudal to cranial direction cleaving the adenoma off the underlying capsule rolling it towards the bladder neck and ultimately cleaving the mucosa to free the median lobe into the bladder.   Next, a semilunar incision was created at the prostatic apex on the left side again freeing up the adenoma from the underlying capsule.  Care was taken to avoid any resection past the verumontanum.  This incision was carried around laterally and cranially towards the bladder neck.  Ultimately, I was able to complete the anterior commissure mucosa and the adenoma into the bladder creating a widely patent prostatic fossa.     Next, the same similar incision was created at the right prostatic apex.  This adenoma however ended up being enucleated and more of a piece wise fashion freeing up a large BPH nodules from the capsular fibers.  Once this was completed and cleared from the bladder neck, the prostatic fossa was noted to be widely patent.  Hemostasis was achieved using hemostatic fiber settings.  Bilateral UOs were visualized and free of any injury.  Finally, the 80 French resectoscope was exchanged for nephroscope and using the Piranha handpiece morcellator, the bladder was distended in each of the prostate chips were evacuated.  The bladder was irrigated several times.  A small mucosal rent was appreciated  just beyond the right you well which appeared to only involve the mucosa, possibly from trauma from the morcellator.  Bugbee electrocautery was used to apply hemostasis area.  The UO itself was intact and had clear yellow reflux.  This point time, there were no  residual prostate chips appreciated in the bladder.  Hemostasis was adequate.  10 mg of IV Lasix was administered to help with postoperative diuresis.  A 20 French two-way Foley catheter was then inserted over a catheter guide with 30 cc in the balloon.  The catheter irrigated easily and well.  Patient was then clean and dry, repositioned supine position, reversed from anesthesia, taken to PACU in stable condition.   Plan: Patient will return to the office in 1 for voiding trial.      Vanna Scotland, M.D.

## 2019-03-30 NOTE — Transfer of Care (Signed)
Immediate Anesthesia Transfer of Care Note  Patient: Jim Guerra.  Procedure(s) Performed: HOLEP-LASER ENUCLEATION OF THE PROSTATE WITH MORCELLATION (N/A Prostate)  Patient Location: PACU  Anesthesia Type:General  Level of Consciousness: drowsy and patient cooperative  Airway & Oxygen Therapy: Patient Spontanous Breathing and Patient connected to face mask oxygen  Post-op Assessment: Report given to RN and Post -op Vital signs reviewed and stable  Post vital signs: Reviewed and stable  Last Vitals:  Vitals Value Taken Time  BP 138/68 03/30/19 1121  Temp 36.3 C 03/30/19 1120  Pulse 73 03/30/19 1124  Resp 21 03/30/19 1124  SpO2 98 % 03/30/19 1124  Vitals shown include unvalidated device data.  Last Pain:  Vitals:   03/30/19 0726  PainSc: 0-No pain         Complications: No apparent anesthesia complications

## 2019-03-30 NOTE — Anesthesia Post-op Follow-up Note (Signed)
Anesthesia QCDR form completed.        

## 2019-03-30 NOTE — Anesthesia Procedure Notes (Signed)
Procedure Name: Intubation Performed by: Soo Steelman, CRNA Pre-anesthesia Checklist: Patient identified, Patient being monitored, Timeout performed, Emergency Drugs available and Suction available Patient Re-evaluated:Patient Re-evaluated prior to induction Oxygen Delivery Method: Circle system utilized Preoxygenation: Pre-oxygenation with 100% oxygen Induction Type: IV induction Ventilation: Mask ventilation without difficulty Laryngoscope Size: McGraph and 4 Grade View: Grade I Tube type: Oral Tube size: 7.5 mm Number of attempts: 1 Airway Equipment and Method: Stylet Placement Confirmation: ETT inserted through vocal cords under direct vision,  positive ETCO2 and breath sounds checked- equal and bilateral Secured at: 23 cm Tube secured with: Tape Dental Injury: Teeth and Oropharynx as per pre-operative assessment        

## 2019-03-30 NOTE — Discharge Instructions (Addendum)
Transurethral Resection of the Prostate, Care After °This sheet gives you information about how to care for yourself after your procedure. Your health care provider may also give you more specific instructions. If you have problems or questions, contact your health care provider. °What can I expect after the procedure? °After the procedure, it is common to have: °· Mild pain in your lower abdomen. °· Soreness or mild discomfort in your penis from having the catheter inserted during the procedure. °· A feeling of urgency when you need to urinate. °· A small amount of blood in your urine. You may notice some small blood clots in your urine. These are normal. °Follow these instructions at home: °Medicines °· Take over-the-counter and prescription medicines only as told by your health care provider. °· If you were prescribed an antibiotic medicine, take it as told by your health care provider. Do not stop taking the antibiotic even if you start to feel better. °· Ask your health care provider if the medicine prescribed to you: °? Requires you to avoid driving or using heavy machinery. °? Can cause constipation. You may need to take actions to prevent or treat constipation, such as: °§ Take over-the-counter or prescription medicines. °§ Eat foods that are high in fiber, such as fresh fruits and vegetables, whole grains, and beans. °§ Limit foods that are high in fat and processed sugars, such as fried or sweet foods. °· Do not drive for 24 hours if you were given a sedative during your procedure. °Activity ° °· Return to your normal activities as told by your health care provider. Ask your health care provider what activities are safe for you. °· Do not lift anything that is heavier than 10 lb (4.5 kg), or the limit that you are told, for 3 weeks after the procedure or until your health care provider says that it is safe. °· Avoid intense physical activity for as long as told by your health care provider. °· Avoid  sitting for a long time without moving. Get up and move around one or more times every few hours. This helps to prevent blood clots. You may increase your physical activity gradually as you start to feel better. °Lifestyle °· Do not drink alcohol for as long as told by your health care provider. This is especially important if you are taking prescription pain medicines. °· Do not engage in sexual activity until your health care provider says that you can do this. °General instructions ° °· Do not take baths, swim, or use a hot tub until your health care provider approves. °· Drink enough fluid to keep your urine pale yellow. °· Urinate as soon as you feel the need to. Do not try to hold your urine for long periods of time. °· If your health care provider approves, you may take a stool softener for 2-3 weeks to prevent you from straining to have a bowel movement. °· Wear compression stockings as told by your health care provider. These stockings help to prevent blood clots and reduce swelling in your legs. °· Keep all follow-up visits as told by your health care provider. This is important. °Contact a health care provider if you have: °· Difficulty urinating. °· A fever. °· Pain that gets worse or does not improve with medicine. °· Blood in your urine that does not go away after 1 week of resting and drinking more fluids. °· Swelling in your penis or testicles. °Get help right away if: °· You are unable   to urinate.  You are having more blood clots in your urine instead of fewer.  You have: ? Large blood clots. ? A lot of blood in your urine. ? Pain in your back or lower abdomen. ? Pain or swelling in your legs. ? Chills and you are shaking. ? Difficulty breathing or shortness of breath. Summary  After the procedure, it is common to have a small amount of blood in your urine.  Avoid heavy lifting and intense physical activity for as long as told by your health care provider.  Urinate as soon as you  feel the need to. Do not try to hold your urine for long periods of time.  Keep all follow-up visits as told by your health care provider. This is important. This information is not intended to replace advice given to you by your health care provider. Make sure you discuss any questions you have with your health care provider. Document Released: 04/23/2005 Document Revised: 08/13/2018 Document Reviewed: 01/22/2018 Elsevier Patient Education  2020 [employer]   1) The drugs that you were given will stay in your system until tomorrow so for the next 24 hours you should not:  A) Drive an automobile B) Make any legal decisions C) Drink any alcoholic beverage   2) You may resume regular meals tomorrow.  Today it is better to start with liquids and gradually work up to solid foods.  You may eat anything you prefer, but it is better to start with liquids, then soup and crackers, and gradually work up to solid foods.   3) Please notify your doctor immediately if you have any unusual bleeding, trouble breathing, redness and pain at the surgery site, drainage, fever, or pain not relieved by medication.    4) Additional Instructions:        Please contact your physician with any problems or Same Day Surgery at [phone], Monday through Friday 6 am to 4 pm, or [place] at [facility] number at [phone].

## 2019-03-30 NOTE — Anesthesia Preprocedure Evaluation (Addendum)
Anesthesia Evaluation  Patient identified by MRN, date of birth, ID band Patient awake    Reviewed: Allergy & Precautions, H&P , NPO status , Patient's Chart, lab work & pertinent test results  Airway Mallampati: II  TM Distance: <3 FB Neck ROM: limited    Dental  (+) Poor Dentition, Missing, Chipped   Pulmonary neg shortness of breath, neg COPD, neg recent URI, Not current smoker, former smoker (quit 1970),           Cardiovascular hypertension, (-) angina+ CAD  (-) Past MI and (-) Cardiac Stents + dysrhythmias (RBBB)      Neuro/Psych Anxiety negative neurological ROS  negative psych ROS   GI/Hepatic negative GI ROS, Neg liver ROS,   Endo/Other  diabetes  Renal/GU      Musculoskeletal   Abdominal   Peds  Hematology negative hematology ROS (+)   Anesthesia Other Findings Past Medical History: No date: Anxiety No date: Arthritis No date: Coronary artery disease No date: Diabetes mellitus without complication (HCC) No date: Dysrhythmia No date: Elevated lipids No date: Environmental allergies No date: HOH (hard of hearing) No date: Hypertension No date: Lymphedema of both lower extremities  Past Surgical History: 08/04/2015: CATARACT EXTRACTION W/PHACO; Right     Comment:  Procedure: CATARACT EXTRACTION PHACO AND INTRAOCULAR               LENS PLACEMENT (IOC);  Surgeon: Birder Robson, MD;                Location: [facility] ORS;  Service: Ophthalmology;  Laterality:              Right;  [place] 00:50 AP% 21.4 CDE 10.79 fluid pack lot #               [id number] No date: COLONOSCOPY No date: FOOT SURGERY No date: HERNIA REPAIR     Comment:  inguinal 1979: SCALP LACERATION REPAIR  BMI    Body Mass Index: 29.39 kg/m      Reproductive/Obstetrics negative OB ROS                           Anesthesia Physical Anesthesia Plan  ASA: II  Anesthesia Plan: General ETT   Post-op Pain  Management:    Induction:   PONV Risk Score and Plan: Ondansetron, Dexamethasone and Treatment may vary due to age or medical condition  Airway Management Planned:   Additional Equipment:   Intra-op Plan:   Post-operative Plan:   Informed Consent: I have reviewed the patients History and Physical, chart, labs and discussed the procedure including the risks, benefits and alternatives for the proposed anesthesia with the patient or authorized representative who has indicated his/her understanding and acceptance.     Dental Advisory Given  Plan Discussed with: Anesthesiologist  Anesthesia Plan Comments:         Anesthesia Quick Evaluation

## 2019-03-30 NOTE — Interval H&P Note (Signed)
History and Physical Interval Note:  03/30/2019 8:43 AM  Jim Guerra.  has presented today for surgery, with the diagnosis of BPH with lower urinary tract symptoms.  The various methods of treatment have been discussed with the patient and family. After consideration of risks, benefits and other options for treatment, the patient has consented to  Procedure(s): [place] WITH MORCELLATION (N/A) as a surgical intervention.  The patient's history has been reviewed, patient examined, no change in status, stable for surgery.  I have reviewed the patient's chart and labs.  Questions were answered to the patient's satisfaction.    RRR CTAB   Hollice Espy

## 2019-03-31 ENCOUNTER — Encounter: Payer: Self-pay | Admitting: Urology

## 2019-03-31 LAB — SURGICAL PATHOLOGY

## 2019-04-07 ENCOUNTER — Ambulatory Visit: Payer: Medicare Other | Admitting: Physician Assistant

## 2019-04-07 ENCOUNTER — Other Ambulatory Visit: Payer: Self-pay

## 2019-04-07 ENCOUNTER — Encounter: Payer: Self-pay | Admitting: Physician Assistant

## 2019-04-07 ENCOUNTER — Ambulatory Visit (INDEPENDENT_AMBULATORY_CARE_PROVIDER_SITE_OTHER): Payer: Medicare Other | Admitting: Physician Assistant

## 2019-04-07 VITALS — BP 100/62 | HR 74 | Ht 72.0 in

## 2019-04-07 DIAGNOSIS — R338 Other retention of urine: Secondary | ICD-10-CM

## 2019-04-07 DIAGNOSIS — N401 Enlarged prostate with lower urinary tract symptoms: Secondary | ICD-10-CM

## 2019-04-07 LAB — BLADDER SCAN AMB NON-IMAGING: Scan Result: 10

## 2019-04-07 NOTE — Patient Instructions (Signed)
Stop Flomax (tamsulosin). Continue finasteride.

## 2019-04-07 NOTE — Progress Notes (Signed)
Fill and Pull Catheter Removal  Patient is present today for a catheter removal.  Patient was cleaned and prepped in a sterile fashion 236ml of sterile water was instilled into the bladder when the patient felt the urge to urinate. 59ml of water was then drained from the balloon.  A 20FR foley cath was removed from the bladder complications were noted as: bladder spasm .  Patient as then given some time to void on their own.  Patient can void  230ml on their own after some time.  Patient tolerated well.  Performed by: Debroah Loop, PA-C   Follow up/ Additional notes: Return this afternoon for PVR.  Afternoon PVR Results for orders placed or performed in visit on 04/07/19  Bladder Scan (Post Void Residual) in office  Result Value Ref Range   Scan Result 10    PVR WNL. No concern for retention at this time. Counseled patient that he can expect dysuria, gross hematuria, and urinary incontinence that will improve over the coming weeks. Counseled him to discontinue Flomax and continue finasteride, per Dr. Erlene Quan.  Return in about 5 weeks (around 05/12/2019) for Postop f/u (me or Erlene Quan).

## 2019-05-19 ENCOUNTER — Ambulatory Visit (INDEPENDENT_AMBULATORY_CARE_PROVIDER_SITE_OTHER): Payer: Medicare Other | Admitting: Urology

## 2019-05-19 ENCOUNTER — Other Ambulatory Visit: Payer: Self-pay

## 2019-05-19 ENCOUNTER — Encounter: Payer: Self-pay | Admitting: Urology

## 2019-05-19 VITALS — BP 133/73 | HR 61 | Ht 72.0 in | Wt 218.0 lb

## 2019-05-19 DIAGNOSIS — N401 Enlarged prostate with lower urinary tract symptoms: Secondary | ICD-10-CM

## 2019-05-19 DIAGNOSIS — R338 Other retention of urine: Secondary | ICD-10-CM

## 2019-05-19 LAB — BLADDER SCAN AMB NON-IMAGING

## 2019-05-19 NOTE — Progress Notes (Signed)
05/19/2019 3:35 PM   Jim Guerra. 16-Sep-1945 [id number]  Referring provider: Idelle Crouch, MD [place] [facility] [place],  [place] [address]  Chief Complaint  Patient presents with  . Benign Prostatic Hypertrophy    HPI: 74 year old male with a history of urinary retention status post holmium laser enucleation of the prostate on 03/30/2019 returns today for routine follow-up.  He has been voiding spontaneously since his voiding trial.  He is very pleased with this.  He feels a good stream is good.  He does have some mild frequency urgency but this is improving.  Minimal leakage.  No dysuria.  No gross hematuria appearing.  He gets up twice at night to void but this is actually improved.  Surgical pathology consistent with BPH without malignancy.  55 g resected in formalin.  Estimated preop size 98 g.  Prior to surgery, he failed multiple voiding trials despite maximal medical therapy.  He remains on finasteride but is now off of Flomax.  IPSS    Row Name 05/19/19 1300         International Prostate Symptom Score   How often have you had the sensation of not emptying your bladder?  Less than 1 in 5     How often have you had to urinate less than every two hours?  About half the time     How often have you found you stopped and started again several times when you urinated?  About half the time     How often have you found it difficult to postpone urination?  More than half the time     How often have you had a weak urinary stream?  Less than half the time     How often have you had to strain to start urination?  Not at All     How many times did you typically get up at night to urinate?  2 Times     Total IPSS Score  15       Quality of Life due to urinary symptoms   If you were to spend the rest of your life with your urinary condition just the way it is now how would you feel about that?  Mostly Satisfied        Score:  1-7 Mild 8-19  Moderate 20-35 Severe   PMH: Past Medical History:  Diagnosis Date  . Anxiety   . Arthritis   . Coronary artery disease   . Diabetes mellitus without complication ([place])   . Dysrhythmia   . Elevated lipids   . Environmental allergies   . HOH (hard of hearing)   . Hypertension   . Lymphedema of both lower extremities     Surgical History: Past Surgical History:  Procedure Laterality Date  . CATARACT EXTRACTION W/PHACO Right 08/04/2015   Procedure: CATARACT EXTRACTION PHACO AND INTRAOCULAR LENS PLACEMENT (IOC);  Surgeon: Birder Robson, MD;  Location: [facility] ORS;  Service: Ophthalmology;  Laterality: Right;  [place] 00:50 AP% 21.4 CDE 10.79 fluid pack lot # [id number]  . COLONOSCOPY    . FOOT SURGERY    . HERNIA REPAIR     inguinal  . HOLEP-LASER ENUCLEATION OF THE PROSTATE WITH MORCELLATION N/A 03/30/2019   Procedure: HOLEP-LASER ENUCLEATION OF THE PROSTATE WITH MORCELLATION;  Surgeon: Hollice Espy, MD;  Location: [facility] ORS;  Service: Urology;  Laterality: N/A;  . SCALP LACERATION REPAIR  1979    Home Medications:  Allergies as of 05/19/2019  No Known Allergies     Medication List       Accurate as of May 19, 2019 11:59 PM. If you have any questions, ask your nurse or doctor.        STOP taking these medications   glyBURIDE 2.5 MG tablet Commonly known as: DIABETA Stopped by: Vanna Scotland, MD   tamsulosin 0.4 MG Caps capsule Commonly known as: FLOMAX Stopped by: Vanna Scotland, MD     TAKE these medications   ALPRAZolam 0.25 MG tablet Commonly known as: XANAX Take 0.25 mg by mouth 2 (two) times daily.   aspirin EC 81 MG tablet Take 81 mg by mouth daily.   finasteride 5 MG tablet Commonly known as: PROSCAR Take 5 mg by mouth daily.   glipiZIDE 5 MG 24 hr tablet Commonly known as: GLUCOTROL XL Take 5 mg by mouth daily.   valsartan-hydrochlorothiazide 320-25 MG tablet Commonly known as: DIOVAN-HCT Take 1 tablet by mouth daily.        Allergies: No Known Allergies  Family History: Family History  Problem Relation Age of Onset  . CAD Mother   . COPD Mother     Social History:  reports that he quit smoking about 51 years ago. He quit smokeless tobacco use about 51 years ago. He reports previous alcohol use. He reports that he does not use drugs.  ROS: UROLOGY Frequent Urination?: No Hard to postpone urination?: No Burning/pain with urination?: No Get up at night to urinate?: No Leakage of urine?: Yes Urine stream starts and stops?: No Trouble starting stream?: No Do you have to strain to urinate?: No Blood in urine?: No Urinary tract infection?: No Sexually transmitted disease?: No Injury to kidneys or bladder?: No Painful intercourse?: No Weak stream?: No Erection problems?: No Penile pain?: No  Gastrointestinal Nausea?: No Vomiting?: No Indigestion/heartburn?: No Diarrhea?: No Constipation?: No  Constitutional Fever: No Night sweats?: No Weight loss?: No Fatigue?: No  Skin Skin rash/lesions?: No Itching?: No  Eyes Blurred vision?: No Double vision?: No  Ears/Nose/Throat Sore throat?: No Sinus problems?: No  Hematologic/Lymphatic Swollen glands?: No Easy bruising?: No  Cardiovascular Leg swelling?: No Chest pain?: No  Respiratory Cough?: No Shortness of breath?: No  Endocrine Excessive thirst?: No  Musculoskeletal Back pain?: No Joint pain?: No  Neurological Headaches?: No Dizziness?: No  Psychologic Depression?: No Anxiety?: No  Physical Exam: BP 133/73   Pulse 61   Ht 6' (1.829 m)   Wt 218 lb (98.9 kg)   BMI 29.57 kg/m   Constitutional:  Alert and oriented, No acute distress.  Accompanied by sister-in-law. HEENT: [place] AT, moist mucus membranes.  Trachea midline, no masses. Cardiovascular: No clubbing, cyanosis, or edema. Respiratory: Normal respiratory effort, no increased work of breathing. Skin: No rashes, bruises or suspicious lesions. Neurologic:  Grossly intact, no focal deficits, moving all 4 extremities. Psychiatric: Normal mood and affect.  Laboratory Data: Lab Results  Component Value Date   WBC 10.2 02/18/2019   HGB 12.6 (L) 02/18/2019   HCT 37.2 (L) 02/18/2019   MCV 87.5 02/18/2019   PLT 286 02/18/2019    Lab Results  Component Value Date   CREATININE 1.43 (H) 02/18/2019    Lab Results  Component Value Date   HGBA1C 7.0 (H) 02/16/2019    Urinalysis    Component Value Date/Time   COLORURINE YELLOW (A) 02/16/2019 1129   APPEARANCEUR Cloudy (A) 03/19/2019 1454   LABSPEC 1.012 02/16/2019 1129   PHURINE 5.0 02/16/2019 1129   GLUCOSEU Negative  03/19/2019 1454   HGBUR NEGATIVE 02/16/2019 1129   BILIRUBINUR Negative 03/19/2019 1454   KETONESUR NEGATIVE 02/16/2019 1129   PROTEINUR 1+ (A) 03/19/2019 1454   PROTEINUR NEGATIVE 02/16/2019 1129   NITRITE Negative 03/19/2019 1454   NITRITE NEGATIVE 02/16/2019 1129   LEUKOCYTESUR Trace (A) 03/19/2019 1454   LEUKOCYTESUR NEGATIVE 02/16/2019 1129    Lab Results  Component Value Date   LABMICR See below: 03/19/2019   WBCUA 0-5 03/19/2019   LABEPIT 0-10 03/19/2019   BACTERIA Few 03/19/2019    Pertinent Imaging: Results for orders placed or performed in visit on 05/19/19  Bladder Scan (Post Void Residual) in office  Result Value Ref Range   Scan Result     Assessment & Plan:    1. Benign prostatic hyperplasia with urinary retention Status post successful HoLEP of the prostate, now voiding spontaneously with satisfactory urinary symptoms  In order to void prostatic regrowth, like to keep on finasteride is for the time being.  Okay to discontinue Flomax as he is already done  We will plan for follow-up in 6 months and if he is stable at that point, he can follow-up with his primary care.  He is agreeable this plan.  Pathology was reviewed. - Bladder Scan (Post Void Residual) in office   Return in about 6 months (around 11/16/2019) for sam for IPSS/  PVR.  Vanna Scotland, MD  [facility] [address] [place], [place] [address] [phone]

## 2019-05-25 ENCOUNTER — Encounter: Payer: Self-pay | Admitting: Urology

## 2019-10-26 ENCOUNTER — Encounter: Payer: Self-pay | Admitting: Ophthalmology

## 2019-10-26 ENCOUNTER — Other Ambulatory Visit: Payer: Self-pay

## 2019-10-29 NOTE — Discharge Instructions (Signed)

## 2019-10-30 ENCOUNTER — Other Ambulatory Visit
Admission: RE | Admit: 2019-10-30 | Discharge: 2019-10-30 | Disposition: A | Discharge status: Home / Self Care | Payer: Medicare Other | Source: Ambulatory Visit | Attending: Ophthalmology | Admitting: Ophthalmology

## 2019-10-30 ENCOUNTER — Other Ambulatory Visit: Payer: Self-pay

## 2019-10-30 DIAGNOSIS — Z20822 Contact with and (suspected) exposure to covid-19: Secondary | ICD-10-CM | POA: Diagnosis not present

## 2019-10-30 DIAGNOSIS — Z01812 Encounter for preprocedural laboratory examination: Secondary | ICD-10-CM | POA: Insufficient documentation

## 2019-10-31 LAB — SARS CORONAVIRUS 2 (TAT 6-24 HRS): SARS Coronavirus 2: NEGATIVE

## 2019-11-03 ENCOUNTER — Ambulatory Visit
Admission: RE | Admit: 2019-11-03 | Discharge: 2019-11-03 | Disposition: A | Discharge status: Home / Self Care | Payer: Medicare Other | Attending: Ophthalmology | Admitting: Ophthalmology

## 2019-11-03 ENCOUNTER — Ambulatory Visit: Payer: Medicare Other | Admitting: Anesthesiology

## 2019-11-03 ENCOUNTER — Encounter: Payer: Self-pay | Admitting: Ophthalmology

## 2019-11-03 ENCOUNTER — Encounter
Admission: RE | Disposition: A | Discharge status: Home / Self Care | Payer: Self-pay | Source: Home / Self Care | Attending: Ophthalmology

## 2019-11-03 ENCOUNTER — Other Ambulatory Visit: Payer: Self-pay

## 2019-11-03 DIAGNOSIS — F419 Anxiety disorder, unspecified: Secondary | ICD-10-CM | POA: Diagnosis not present

## 2019-11-03 DIAGNOSIS — I2510 Atherosclerotic heart disease of native coronary artery without angina pectoris: Secondary | ICD-10-CM | POA: Insufficient documentation

## 2019-11-03 DIAGNOSIS — H2512 Age-related nuclear cataract, left eye: Secondary | ICD-10-CM | POA: Diagnosis not present

## 2019-11-03 DIAGNOSIS — N400 Enlarged prostate without lower urinary tract symptoms: Secondary | ICD-10-CM | POA: Insufficient documentation

## 2019-11-03 DIAGNOSIS — Z9841 Cataract extraction status, right eye: Secondary | ICD-10-CM | POA: Insufficient documentation

## 2019-11-03 DIAGNOSIS — M1990 Unspecified osteoarthritis, unspecified site: Secondary | ICD-10-CM | POA: Insufficient documentation

## 2019-11-03 DIAGNOSIS — Z79899 Other long term (current) drug therapy: Secondary | ICD-10-CM | POA: Diagnosis not present

## 2019-11-03 DIAGNOSIS — Z7982 Long term (current) use of aspirin: Secondary | ICD-10-CM | POA: Diagnosis not present

## 2019-11-03 DIAGNOSIS — Z7984 Long term (current) use of oral hypoglycemic drugs: Secondary | ICD-10-CM | POA: Diagnosis not present

## 2019-11-03 DIAGNOSIS — E1136 Type 2 diabetes mellitus with diabetic cataract: Secondary | ICD-10-CM | POA: Insufficient documentation

## 2019-11-03 DIAGNOSIS — I10 Essential (primary) hypertension: Secondary | ICD-10-CM | POA: Insufficient documentation

## 2019-11-03 DIAGNOSIS — Z87891 Personal history of nicotine dependence: Secondary | ICD-10-CM | POA: Insufficient documentation

## 2019-11-03 HISTORY — PX: CATARACT EXTRACTION W/PHACO: SHX586

## 2019-11-03 LAB — GLUCOSE, CAPILLARY
Glucose-Capillary: 78 mg/dL (ref 70–99)
Glucose-Capillary: 75 mg/dL (ref 70–99)

## 2019-11-03 SURGERY — PHACOEMULSIFICATION, CATARACT, WITH IOL INSERTION
Anesthesia: Monitor Anesthesia Care | Site: Eye | Laterality: Left

## 2019-11-03 MED ORDER — LIDOCAINE HCL (PF) 2 % IJ SOLN
INTRAOCULAR | Status: DC | PRN
  Administered 2019-11-03: 2 mL

## 2019-11-03 MED ORDER — FENTANYL CITRATE (PF) 100 MCG/2ML IJ SOLN
INTRAMUSCULAR | Status: DC | PRN
  Administered 2019-11-03: 50 ug via INTRAVENOUS

## 2019-11-03 MED ORDER — MIDAZOLAM HCL 2 MG/2ML IJ SOLN
INTRAMUSCULAR | Status: DC | PRN
  Administered 2019-11-03: 1 mg via INTRAVENOUS

## 2019-11-03 MED ORDER — LACTATED RINGERS IV SOLN: INTRAVENOUS | Status: DC

## 2019-11-03 MED ORDER — BRIMONIDINE TARTRATE-TIMOLOL 0.2-0.5 % OP SOLN
OPHTHALMIC | Status: DC | PRN
  Administered 2019-11-03: 1 [drp] via OPHTHALMIC

## 2019-11-03 MED ORDER — TETRACAINE HCL 0.5 % OP SOLN
1.0000 [drp] | OPHTHALMIC | Status: DC | PRN
  Administered 2019-11-03 (×3): 1 [drp] via OPHTHALMIC

## 2019-11-03 MED ORDER — NA CHONDROIT SULF-NA HYALURON 40-17 MG/ML IO SOLN
INTRAOCULAR | Status: DC | PRN
  Administered 2019-11-03: 1 mL via INTRAOCULAR

## 2019-11-03 MED ORDER — ARMC OPHTHALMIC DILATING DROPS
1.0000 "application " | OPHTHALMIC | Status: DC | PRN
  Administered 2019-11-03 (×3): 1 via OPHTHALMIC

## 2019-11-03 MED ORDER — ACETAMINOPHEN 10 MG/ML IV SOLN: 1000.0000 mg | Freq: Once | INTRAVENOUS | Status: DC | PRN

## 2019-11-03 MED ORDER — ONDANSETRON HCL 4 MG/2ML IJ SOLN: 4.0000 mg | Freq: Once | INTRAMUSCULAR | Status: DC | PRN

## 2019-11-03 MED ORDER — EPINEPHRINE PF 1 MG/ML IJ SOLN
INTRAOCULAR | Status: DC | PRN
  Administered 2019-11-03: 48 mL via OPHTHALMIC

## 2019-11-03 MED ORDER — MOXIFLOXACIN HCL 0.5 % OP SOLN
OPHTHALMIC | Status: DC | PRN
  Administered 2019-11-03: 0.2 mL via OPHTHALMIC

## 2019-11-03 SURGICAL SUPPLY — 20 items
CANNULA ANT/CHMB 27G (MISCELLANEOUS) ×2 IMPLANT
CANNULA ANT/CHMB 27GA (MISCELLANEOUS) ×4 IMPLANT
GLOVE SURG LX 8.0 MICRO (GLOVE) ×1
GLOVE SURG LX STRL 8.0 MICRO (GLOVE) ×1 IMPLANT
GLOVE SURG TRIUMPH 8.0 PF LTX (GLOVE) ×2 IMPLANT
GOWN STRL REUS W/ TWL LRG LVL3 (GOWN DISPOSABLE) ×2 IMPLANT
GOWN STRL REUS W/TWL LRG LVL3 (GOWN DISPOSABLE) ×2
LENS IOL DIOP 18.0 (Intraocular Lens) ×2 IMPLANT
LENS IOL TECNIS MONO 18.0 (Intraocular Lens) IMPLANT
MARKER SKIN DUAL TIP RULER LAB (MISCELLANEOUS) ×2 IMPLANT
NDL FILTER BLUNT 18X1 1/2 (NEEDLE) ×1 IMPLANT
NEEDLE FILTER BLUNT 18X 1/2SAF (NEEDLE) ×1
NEEDLE FILTER BLUNT 18X1 1/2 (NEEDLE) ×1 IMPLANT
PACK EYE AFTER SURG (MISCELLANEOUS) ×2 IMPLANT
PACK OPTHALMIC (MISCELLANEOUS) ×2 IMPLANT
PACK PORFILIO (MISCELLANEOUS) ×2 IMPLANT
SYR 3ML LL SCALE MARK (SYRINGE) ×2 IMPLANT
SYR TB 1ML LUER SLIP (SYRINGE) ×2 IMPLANT
WATER STERILE IRR 250ML POUR (IV SOLUTION) ×2 IMPLANT
WIPE NON LINTING 3.25X3.25 (MISCELLANEOUS) ×2 IMPLANT

## 2019-11-03 NOTE — H&P (Signed)
All labs reviewed. Abnormal studies sent to patients PCP when indicated.  Previous H&P reviewed, patient examined, there are NO CHANGES.  Jim Deakins Porfilio6/29/20218:53 AM

## 2019-11-03 NOTE — Anesthesia Preprocedure Evaluation (Signed)
Anesthesia Evaluation  Patient identified by MRN, date of birth, ID band Patient awake    Reviewed: Allergy & Precautions, NPO status , Patient's Chart, lab work & pertinent test results, reviewed documented beta blocker date and time   History of Anesthesia Complications Negative for: history of anesthetic complications  Airway Mallampati: III  TM Distance: >3 FB Neck ROM: Full    Dental  (+) Poor Dentition, Missing, Chipped,    Pulmonary former smoker,    breath sounds clear to auscultation       Cardiovascular hypertension, (-) angina+ CAD  (-) DOE + dysrhythmias  Rhythm:Regular Rate:Normal   HLD   Neuro/Psych PSYCHIATRIC DISORDERS Anxiety    GI/Hepatic neg GERD  ,  Endo/Other  diabetes, Type 2  Renal/GU      Musculoskeletal  (+) Arthritis ,   Abdominal   Peds  Hematology   Anesthesia Other Findings   Reproductive/Obstetrics                            Anesthesia Physical Anesthesia Plan  ASA: II  Anesthesia Plan: MAC   Post-op Pain Management:    Induction: Intravenous  PONV Risk Score and Plan: 1 and TIVA, Midazolam and Treatment may vary due to age or medical condition  Airway Management Planned: Nasal Cannula  Additional Equipment:   Intra-op Plan:   Post-operative Plan:   Informed Consent: I have reviewed the patients History and Physical, chart, labs and discussed the procedure including the risks, benefits and alternatives for the proposed anesthesia with the patient or authorized representative who has indicated his/her understanding and acceptance.       Plan Discussed with: CRNA and Anesthesiologist  Anesthesia Plan Comments:         Anesthesia Quick Evaluation

## 2019-11-03 NOTE — Anesthesia Postprocedure Evaluation (Signed)
Anesthesia Post Note  Patient: Jim Guerra.  Procedure(s) Performed: CATARACT EXTRACTION PHACO AND INTRAOCULAR LENS PLACEMENT (IOC) LEFT DIABETIC (Left Eye)     Patient location during evaluation: PACU Anesthesia Type: MAC Level of consciousness: awake and alert Pain management: pain level controlled Vital Signs Assessment: post-procedure vital signs reviewed and stable Respiratory status: spontaneous breathing, nonlabored ventilation, respiratory function stable and patient connected to nasal cannula oxygen Cardiovascular status: stable and blood pressure returned to baseline Postop Assessment: no apparent nausea or vomiting Anesthetic complications: no   No complications documented.  Tiauna Whisnant A  Vaida Kerchner

## 2019-11-03 NOTE — Op Note (Signed)
PREOPERATIVE DIAGNOSIS:  Nuclear sclerotic cataract of the left eye.   POSTOPERATIVE DIAGNOSIS:  Nuclear sclerotic cataract of the left eye.   OPERATIVE PROCEDURE:@   SURGEON:  Galen Manila, MD.   ANESTHESIA:  Anesthesiologist: Heniser, Burman Foster, MD CRNA: Maree Krabbe, CRNA  1.      Managed anesthesia care. 2.     0.86ml of Shugarcaine was instilled following the paracentesis   COMPLICATIONS:  None.   TECHNIQUE:   Stop and chop   DESCRIPTION OF PROCEDURE:  The patient was examined and consented in the preoperative holding area where the aforementioned topical anesthesia was applied to the left eye and then brought back to the Operating Room where the left eye was prepped and draped in the usual sterile ophthalmic fashion and a lid speculum was placed. A paracentesis was created with the side port blade and the anterior chamber was filled with viscoelastic. A near clear corneal incision was performed with the steel keratome. A continuous curvilinear capsulorrhexis was performed with a cystotome followed by the capsulorrhexis forceps. Hydrodissection and hydrodelineation were carried out with BSS on a blunt cannula. The lens was removed in a stop and chop  technique and the remaining cortical material was removed with the irrigation-aspiration handpiece. The capsular bag was inflated with viscoelastic and the Technis [id number] lens was placed in the capsular bag without complication. The remaining viscoelastic was removed from the eye with the irrigation-aspiration handpiece. The wounds were hydrated. The anterior chamber was flushed with BSS and the eye was inflated to physiologic pressure. 0.91ml Vigamox was placed in the anterior chamber. The wounds were found to be water tight. The eye was dressed with Combigan. The patient was given protective glasses to wear throughout the day and a shield with which to sleep tonight. The patient was also given drops with which to begin a drop regimen today and  will follow-up with me in one day. Implant Name Type Inv. Item Serial No. Manufacturer Lot No. LRB No. Used Action  LENS IOL DIOP 18.0 - [id number] Intraocular Lens LENS IOL DIOP 18.0 [id number] [employer]  Left 1 Implanted    Procedure(s) with comments: CATARACT EXTRACTION PHACO AND INTRAOCULAR LENS PLACEMENT (IOC) LEFT DIABETIC (Left) - 6.25 0:37.1  Electronically signed: Galen Manila 11/03/2019 9:17 AM

## 2019-11-03 NOTE — Transfer of Care (Signed)
Immediate Anesthesia Transfer of Care Note  Patient: Jim Guerra.  Procedure(s) Performed: CATARACT EXTRACTION PHACO AND INTRAOCULAR LENS PLACEMENT (IOC) LEFT DIABETIC (Left Eye)  Patient Location: PACU  Anesthesia Type: MAC  Level of Consciousness: awake, alert  and patient cooperative  Airway and Oxygen Therapy: Patient Spontanous Breathing and Patient connected to supplemental oxygen  Post-op Assessment: Post-op Vital signs reviewed, Patient's Cardiovascular Status Stable, Respiratory Function Stable, Patent Airway and No signs of Nausea or vomiting  Post-op Vital Signs: Reviewed and stable  Complications: No complications documented.

## 2019-11-03 NOTE — Anesthesia Procedure Notes (Signed)
Procedure Name: MAC Date/Time: 11/03/2019 9:00 AM Performed by: Cameron Ali, CRNA Pre-anesthesia Checklist: Patient identified, Emergency Drugs available, Suction available, Timeout performed and Patient being monitored Patient Re-evaluated:Patient Re-evaluated prior to induction Oxygen Delivery Method: Nasal cannula Placement Confirmation: positive ETCO2

## 2019-11-04 ENCOUNTER — Encounter: Payer: Self-pay | Admitting: Ophthalmology

## 2019-11-16 ENCOUNTER — Ambulatory Visit: Payer: Self-pay | Admitting: Physician Assistant

## 2019-11-18 ENCOUNTER — Ambulatory Visit (INDEPENDENT_AMBULATORY_CARE_PROVIDER_SITE_OTHER): Payer: Medicare Other | Admitting: Physician Assistant

## 2019-11-18 ENCOUNTER — Other Ambulatory Visit: Payer: Self-pay

## 2019-11-18 ENCOUNTER — Encounter: Payer: Self-pay | Admitting: Physician Assistant

## 2019-11-18 VITALS — BP 102/59 | HR 71

## 2019-11-18 DIAGNOSIS — R338 Other retention of urine: Secondary | ICD-10-CM

## 2019-11-18 DIAGNOSIS — N401 Enlarged prostate with lower urinary tract symptoms: Secondary | ICD-10-CM | POA: Diagnosis not present

## 2019-11-18 LAB — BLADDER SCAN AMB NON-IMAGING

## 2019-11-18 NOTE — Progress Notes (Signed)
11/18/2019 2:19 PM   Jim Guerra. February 20, 1946 [id number]  CC: Chief Complaint  Patient presents with  . Benign Prostatic Hypertrophy    HPI: Jim Guerra. is a 74 y.o. male with PMH BPH with urinary retention s/p HOLEP with Dr. Apolinar Junes on 03/30/2019 who presents today for routine 7-month follow-up.  Today, he reports doing well since surgery. IPSS 1/0 as below. He does have mild urinary leakage overnight associated with turning in bed and he wears absorbant pads to bed to control for this. He has no concerns today but is curious regarding continued PSA monitoring moving forward.   Notably, surgical pathology from HOLEP was negative for malignancy.  PVR 23 mL.  He remains on finasteride.     IPSS    Row Name 11/18/19 1400         International Prostate Symptom Score   How often have you had the sensation of not emptying your bladder? Not at All     How often have you had to urinate less than every two hours? Not at All     How often have you found you stopped and started again several times when you urinated? Not at All     How often have you found it difficult to postpone urination? Not at All     How often have you had a weak urinary stream? Not at All     How often have you had to strain to start urination? Not at All     How many times did you typically get up at night to urinate? 1 Time     Total IPSS Score 1       Quality of Life due to urinary symptoms   If you were to spend the rest of your life with your urinary condition just the way it is now how would you feel about that? Delighted           PMH: Past Medical History:  Diagnosis Date  . Anxiety   . Arthritis   . Coronary artery disease   . Diabetes mellitus without complication (HCC)   . Dysrhythmia   . Elevated lipids   . Environmental allergies   . HOH (hard of hearing)   . Hypertension   . Lymphedema of both lower extremities     Surgical History: Past Surgical History:    Procedure Laterality Date  . CATARACT EXTRACTION W/PHACO Right 08/04/2015   Procedure: CATARACT EXTRACTION PHACO AND INTRAOCULAR LENS PLACEMENT (IOC);  Surgeon: Galen Manila, MD;  Location: [facility] ORS;  Service: Ophthalmology;  Laterality: Right;  [place] 00:50 AP% 21.4 CDE 10.79 fluid pack lot # [id number]  . CATARACT EXTRACTION W/PHACO Left 11/03/2019   Procedure: CATARACT EXTRACTION PHACO AND INTRAOCULAR LENS PLACEMENT (IOC) LEFT DIABETIC;  Surgeon: Galen Manila, MD;  Location[facility];  Service: Ophthalmology;  Laterality: Left;  6.25 0:37.1  . COLONOSCOPY    . FOOT SURGERY    . HERNIA REPAIR     inguinal  . HOLEP-LASER ENUCLEATION OF THE PROSTATE WITH MORCELLATION N/A 03/30/2019   Procedure: HOLEP-LASER ENUCLEATION OF THE PROSTATE WITH MORCELLATION;  Surgeon: Vanna Scotland, MD;  Location: [facility] ORS;  Service: Urology;  Laterality: N/A;  . SCALP LACERATION REPAIR  1979    Home Medications:  Allergies as of 11/18/2019   No Known Allergies     Medication List       Accurate as of November 18, 2019  2:19 PM. If you have any  questions, ask your nurse or doctor.        ALPRAZolam 0.25 MG tablet Commonly known as: XANAX Take 0.25 mg by mouth 2 (two) times daily.   aspirin EC 81 MG tablet Take 81 mg by mouth daily.   finasteride 5 MG tablet Commonly known as: PROSCAR Take 5 mg by mouth daily.   glipiZIDE 5 MG 24 hr tablet Commonly known as: GLUCOTROL XL Take 5 mg by mouth daily.   valsartan-hydrochlorothiazide 320-25 MG tablet Commonly known as: DIOVAN-HCT Take 1 tablet by mouth daily.       Allergies:  No Known Allergies  Family History: Family History  Problem Relation Age of Onset  . CAD Mother   . COPD Mother     Social History:   reports that he quit smoking about 51 years ago. He quit smokeless tobacco use about 51 years ago. He reports previous alcohol use. He reports that he does not use drugs.  Physical Exam: BP (!) 102/59   Pulse 71    Constitutional:  Alert and oriented, no acute distress, nontoxic appearing HEENT: [place], AT Cardiovascular: No clubbing, cyanosis, or edema Respiratory: Normal respiratory effort, no increased work of breathing Skin: No rashes, bruises or suspicious lesions Neurologic: Grossly intact, no focal deficits, moving all 4 extremities Psychiatric: Normal mood and affect  Laboratory Data: Results for orders placed or performed in visit on 11/18/19  BLADDER SCAN AMB NON-IMAGING  Result Value Ref Range   Scan Result 53ml    Assessment & Plan:   1. Benign prostatic hyperplasia with urinary retention Patient is recovering well following HOLEP with resolution of his urinary retention.  Okay to release from urologic care at this time.  Counseled patient to start Kegel exercises to increase urinary control overnight.  Written instructions also provided today.  Recommend continuing finasteride to reduce tissue regrowth following HOLEP.  Recommend discontinuing PSA monitoring given his age and recent negative HOLEP pathology. - BLADDER SCAN AMB NON-IMAGING  Return if symptoms worsen or fail to improve.  Carman Ching, PA-C  [facility] [address], [address] [place], [place] [address] [phone]

## 2019-11-18 NOTE — Patient Instructions (Addendum)
Continue finasteride 5mg  daily. You may follow-up with Dr. to have him refill this medication from now on. You do not need to come back to see Judithann Sheen unless you have any new questions or concerns. Congratulations, you're doing great!  Kegel Exercises  Kegel exercises can help strengthen your pelvic floor muscles. The pelvic floor is a group of muscles that support your rectum, small intestine, and bladder. In females, pelvic floor muscles also help support the womb (uterus). These muscles help you control the flow of urine and stool. Kegel exercises are painless and simple, and they do not require any equipment. Your provider may suggest Kegel exercises to:  Improve bladder and bowel control.  Improve sexual response.  Improve weak pelvic floor muscles after surgery to remove the uterus (hysterectomy) or pregnancy (females).  Improve weak pelvic floor muscles after prostate gland removal or surgery (males). Kegel exercises involve squeezing your pelvic floor muscles, which are the same muscles you squeeze when you try to stop the flow of urine or keep from passing gas. The exercises can be done while sitting, standing, or lying down, but it is best to vary your position. Exercises How to do Kegel exercises: 1. Squeeze your pelvic floor muscles tight. You should feel a tight lift in your rectal area. If you are a male, you should also feel a tightness in your vaginal area. Keep your stomach, buttocks, and legs relaxed. 2. Hold the muscles tight for up to 10 seconds. 3. Breathe normally. 4. Relax your muscles. 5. Repeat as told by your health care provider. Repeat this exercise daily as told by your health care provider. Continue to do this exercise for at least 4-6 weeks, or for as long as told by your health care provider. You may be referred to a physical therapist who can help you learn more about how to do Kegel exercises. Depending on your condition, your health care provider may  recommend:  Varying how long you squeeze your muscles.  Doing several sets of exercises every day.  Doing exercises for several weeks.  Making Kegel exercises a part of your regular exercise routine. This information is not intended to replace advice given to you by your health care provider. Make sure you discuss any questions you have with your health care provider. Document Revised: 12/11/2017 Document Reviewed: 12/11/2017 Elsevier Patient Education  2020 02/10/2018.

## 2019-12-08 ENCOUNTER — Ambulatory Visit (INDEPENDENT_AMBULATORY_CARE_PROVIDER_SITE_OTHER): Payer: Medicare Other | Admitting: Vascular Surgery

## 2019-12-08 ENCOUNTER — Encounter (INDEPENDENT_AMBULATORY_CARE_PROVIDER_SITE_OTHER): Payer: Medicare Other

## 2019-12-18 ENCOUNTER — Other Ambulatory Visit: Payer: Self-pay

## 2019-12-18 ENCOUNTER — Ambulatory Visit (INDEPENDENT_AMBULATORY_CARE_PROVIDER_SITE_OTHER): Payer: Medicare Other | Admitting: Vascular Surgery

## 2019-12-18 ENCOUNTER — Ambulatory Visit (INDEPENDENT_AMBULATORY_CARE_PROVIDER_SITE_OTHER): Payer: Medicare Other

## 2019-12-18 ENCOUNTER — Encounter (INDEPENDENT_AMBULATORY_CARE_PROVIDER_SITE_OTHER): Payer: Self-pay | Admitting: Vascular Surgery

## 2019-12-18 VITALS — BP 114/72 | HR 61 | Resp 16 | Wt 215.0 lb

## 2019-12-18 DIAGNOSIS — I10 Essential (primary) hypertension: Secondary | ICD-10-CM

## 2019-12-18 DIAGNOSIS — E118 Type 2 diabetes mellitus with unspecified complications: Secondary | ICD-10-CM

## 2019-12-18 DIAGNOSIS — I739 Peripheral vascular disease, unspecified: Secondary | ICD-10-CM

## 2019-12-18 DIAGNOSIS — E785 Hyperlipidemia, unspecified: Secondary | ICD-10-CM

## 2019-12-18 DIAGNOSIS — I890 Lymphedema, not elsewhere classified: Secondary | ICD-10-CM | POA: Diagnosis not present

## 2019-12-18 NOTE — Assessment & Plan Note (Signed)
Swelling is fairly well controlled.  No major exacerbations.  Not wearing stockings but he does elevate his legs and stay active.  If the swelling worsens, contact our office for consideration for wraps or addition of the lymphedema pump.

## 2019-12-18 NOTE — Patient Instructions (Signed)

## 2019-12-18 NOTE — Assessment & Plan Note (Signed)
No current limb threatening symptoms.  Perfusion is well preserved with ABI on the right of 1.16 on the left of 1.20.  No role for intervention.  Continue aspirin.  Recheck in 1 year.

## 2019-12-18 NOTE — Assessment & Plan Note (Signed)
blood glucose control important in reducing the progression of atherosclerotic disease. Also, involved in wound healing. On appropriate medications.  

## 2019-12-18 NOTE — Assessment & Plan Note (Signed)
blood pressure control important in reducing the progression of atherosclerotic disease. On appropriate oral medications.  

## 2019-12-18 NOTE — Progress Notes (Signed)
MRN : [id number]  Jim Guerra. is a 74 y.o. ([birth year]) male who presents with chief complaint of  Chief Complaint  Patient presents with  . Follow-up    ultrasound and lymph follow up  .  History of Present Illness: Patient returns today in follow up of multiple vascular issues.  His swelling is under reasonably good control.  He is not wearing compression stockings as he says he cannot get them on or off.  He has diabetic socks that he wears daily and he does do a pretty good job of elevating his legs and staying active.  No new ulceration or infection.  The right leg is a little more swollen than the left. He is also followed for his mild PAD.  No current lifestyle limiting claudication, ischemic rest pain, or ulceration.  His ABIs today are in the normal range with multiphasic waveforms.  Current Outpatient Medications  Medication Sig Dispense Refill  . ALPRAZolam (XANAX) 0.25 MG tablet Take 0.25 mg by mouth 2 (two) times daily.    Marland Kitchen aspirin EC 81 MG tablet Take 81 mg by mouth daily.     . finasteride (PROSCAR) 5 MG tablet Take 5 mg by mouth daily.    Marland Kitchen glipiZIDE (GLUCOTROL XL) 5 MG 24 hr tablet Take 5 mg by mouth daily.    . valsartan-hydrochlorothiazide (DIOVAN-HCT) 320-25 MG tablet Take 1 tablet by mouth daily.     No current facility-administered medications for this visit.    Past Medical History:  Diagnosis Date  . Anxiety   . Arthritis   . Coronary artery disease   . Diabetes mellitus without complication (HCC)   . Dysrhythmia   . Elevated lipids   . Environmental allergies   . HOH (hard of hearing)   . Hypertension   . Lymphedema of both lower extremities     Past Surgical History:  Procedure Laterality Date  . CATARACT EXTRACTION W/PHACO Right 08/04/2015   Procedure: CATARACT EXTRACTION PHACO AND INTRAOCULAR LENS PLACEMENT (IOC);  Surgeon: Galen Manila, MD;  Location: [facility];  Service: Ophthalmology;  Laterality: Right;  [place] 00:50 AP% 21.4 CDE  10.79 fluid pack lot # [id number]  . CATARACT EXTRACTION W/PHACO Left 11/03/2019   Procedure: CATARACT EXTRACTION PHACO AND INTRAOCULAR LENS PLACEMENT (IOC) LEFT DIABETIC;  Surgeon: Galen Manila, MD;  Location: [facility];  Service: Ophthalmology;  Laterality: Left;  6.25 0:37.1  . COLONOSCOPY    . FOOT SURGERY    . HERNIA REPAIR     inguinal  . HOLEP-LASER ENUCLEATION OF THE PROSTATE WITH MORCELLATION N/A 03/30/2019   Procedure: HOLEP-LASER ENUCLEATION OF THE PROSTATE WITH MORCELLATION;  Surgeon: Vanna Scotland, MD;  Location: [facility];  Service: Urology;  Laterality: N/A;  . SCALP LACERATION REPAIR  1979     Social History   Tobacco Use  . Smoking status: Former Smoker    Quit date: 1970    Years since quitting: 51.6  . Smokeless tobacco: Former Neurosurgeon    Quit date: Geophysicist/field seismologist  . Vaping Use: Never assessed  Substance Use Topics  . Alcohol use: Not Currently    Comment: quit 1977  . Drug use: No      Family History  Problem Relation Age of Onset  . CAD Mother   . COPD Mother   No bleeding or clotting disorders  No Known Allergies   REVIEW OF SYSTEMS (Negative unless checked) Constitutional: [] ?Weight loss[] ?Fever[] ?Chills Cardiac:[] ?Chest pain[] ? Atrial Fibrillation[] ?Palpitations [] ?Shortness of breath when  laying flat [] ?Shortness of breath with exertion. [] ?Shortness of breath at rest Vascular: [] ?Pain in legs with walking[] ?Pain in legswith standing[] ?Pain in legs when laying flat [] ?Claudication  [] ?Pain in feet when laying flat [] ?History of DVT [] ?Phlebitis [x] ?Swelling in legs [x] ?Varicose veins [] ?Non-healing ulcers Pulmonary: [] ?Uses home oxygen [] ?Productive cough[] ?Hemoptysis [] ?Wheeze [] ?COPD [] ?Asthma Neurologic: [] ?Dizziness[] ?Seizures [] ?Blackouts[] ?History of stroke [] ?History of TIA[] ?Aphasia [] ?Temporary Blindness[] ?Weaknessor numbness in arm [] ?Weakness or numbnessin  leg Musculoskeletal:[] ?Joint swelling [] ?Joint pain [] ?Low back pain  [] ? History of Knee Replacement [x] ?Arthritis [] ?back Surgeries[] ? Spinal Stenosis  Hematologic:[] ?Easy bruising[] ?Easy bleeding [] ?Hypercoagulable state [] ?Anemic Gastrointestinal:[] ?Diarrhea [] ?Vomiting[] ?Gastroesophageal reflux/heartburn[] ?Difficulty swallowing. [] ?Abdominal pain Genitourinary: [] ?Chronic kidney disease [] ?Difficulturination [] ?Anuric[] ?Blood in urine [] ?Frequenturination [] ?Burning with urination[] ?Hematuria Skin: [] ?Rashes [] ?Ulcers [] ?Wounds Psychological: [x] ?History of anxiety[] ?History of major depression  [] ? Memory Difficulties  Physical Examination  BP 114/72 (BP Location: Right Arm)   Pulse 61   Resp 16   Wt 215 lb (97.5 kg)   BMI 29.99 kg/m  Gen:  WD/WN, NAD Head: [place]/AT, No temporalis wasting. Ear/Nose/Throat: Hearing grossly intact, nares w/o erythema or drainage Eyes: Conjunctiva clear. Sclera non-icteric Neck: Supple.  Trachea midline Pulmonary:  Good air movement, no use of accessory muscles.  Cardiac: RRR, no JVD Vascular:  Vessel Right Left  Radial Palpable Palpable                          PT Trace Palpable 1+ Palpable  DP 1+ Palpable 1+ Palpable   Gastrointestinal: soft, non-tender/non-distended. No guarding/reflex.  Musculoskeletal: M/S 5/5 throughout.  No deformity or atrophy. 1-2+ RLE edema, 1+ LLE edema. Neurologic: Sensation grossly intact in extremities.  Symmetrical.  Speech is fluent.  Psychiatric: Judgment intact, Mood & affect appropriate for pt's clinical situation. Dermatologic: No rashes or ulcers noted.  No cellulitis or open wounds.       Labs Recent Results (from the past 2160 hour(s))  SARS CORONAVIRUS 2 (TAT 6-24 HRS) Nasopharyngeal Nasopharyngeal Swab     Status: None   Collection Time: 10/30/19  2:10 PM   Specimen: Nasopharyngeal Swab  Result Value Ref Range   SARS Coronavirus 2 NEGATIVE  NEGATIVE    Comment: (NOTE) SARS-CoV-2 target nucleic acids are NOT DETECTED.  The SARS-CoV-2 RNA is generally detectable in upper and lower respiratory specimens during the acute phase of infection. Negative results do not preclude SARS-CoV-2 infection, do not rule out co-infections with other pathogens, and should not be used as the sole basis for treatment or other patient management decisions. Negative results must be combined with clinical observations, patient history, and epidemiological information. The expected result is Negative.  Fact Sheet for Patients:  Fact Sheet for Healthcare Providers:  This test is not yet approved or cleared by the FDA and  has been authorized for detection and/or diagnosis of SARS-CoV-2 by FDA under an Emergency Use Authorization (EUA). This EUA will remain  in effect (meaning this test can be used) for the duration of the COVID-19 declaration under Se ction 564(b)(1) of the Act, 21 U.S.C. section 360bbb-3(b)(1), unless the authorization is terminated or revoked sooner.  Performed at [facility], [address] [place],    Glucose, capillary     Status: None   Collection Time: 11/03/19  7:39 AM  Result Value Ref Range   Glucose-Capillary 78 70 - 99 mg/dL    Comment: Glucose reference range applies only to samples taken after fasting for at least 8 hours.  Glucose, capillary  Status: None   Collection Time: 11/03/19  9:19 AM  Result Value Ref Range   Glucose-Capillary 75 70 - 99 mg/dL    Comment: Glucose reference range applies only to samples taken after fasting for at least 8 hours.  BLADDER SCAN AMB NON-IMAGING     Status: None   Collection Time: 11/18/19  1:57 PM  Result Value Ref Range   Scan Result 23ml     Radiology VAS US ABI WITH/WO TBI  Result Date: 12/18/2019 LOWER EXTREMITY DOPPLER STUDY  Indications: Peripheral artery disease.  Comparison Study: 12/02/2018 Performing Technologist: Salvadore Farbererry Knight RVT  Examination Guidelines: A complete evaluation includes at minimum, Doppler waveform signals and systolic blood pressure reading at the level of bilateral brachial, anterior tibial, and posterior tibial arteries, when vessel segments are accessible. Bilateral testing is considered an integral part of a complete examination. Photoelectric Plethysmograph (PPG) waveforms and toe systolic pressure readings are included as required and additional duplex testing as needed. Limited examinations for reoccurring indications may be performed as noted.  ABI Findings: +---------+------------------+-----+---------+--------+ Right    Rt Pressure (mmHg)IndexWaveform Comment  +---------+------------------+-----+---------+--------+ Brachial 123                                      +---------+------------------+-----+---------+--------+ ATA      122               0.99 biphasic          +---------+------------------+-----+---------+--------+ PTA      143               1.16 triphasic         +---------+------------------+-----+---------+--------+ Great Toe106               0.86 Normal            +---------+------------------+-----+---------+--------+ +---------+------------------+-----+--------+-------+ Left     Lt Pressure (mmHg)IndexWaveformComment +---------+------------------+-----+--------+-------+ ATA      141               1.15 biphasic        +---------+------------------+-----+--------+-------+ PTA      148               1.20 biphasic        +---------+------------------+-----+--------+-------+ Ernst SpellGreat Toe123               1.00 Normal          +---------+------------------+-----+--------+-------+ +-------+-----------+-----------+------------+------------+ ABI/TBIToday's ABIToday's TBIPrevious ABIPrevious TBI  +-------+-----------+-----------+------------+------------+ Right  1.16       .86        1.15        .70          +-------+-----------+-----------+------------+------------+ Left   1.20       1.00       1.15        .76          +-------+-----------+-----------+------------+------------+ Bilateral ABIs and TBIs appear essentially unchanged compared to prior study on 12/02/2018.  Summary: Right: Resting right ankle-brachial index is within normal range. No evidence of significant right lower extremity arterial disease. The right toe-brachial index is normal. Left: Resting left ankle-brachial index is within normal range. No evidence of significant left lower extremity arterial disease. The left toe-brachial index is normal.  *See table(s) above for measurements and observations.  Electronically signed by Festus BarrenJason Pennye Beeghly MD on 12/18/2019 at 12:18:14 PM.   Final     Assessment/Plan  No problem-specific  Assessment & Plan notes found for this encounter.    Festus Barren, MD  12/18/2019 3:06 PM    This note was created with Dragon medical transcription system.  Any errors from dictation are purely unintentional

## 2020-03-11 IMAGING — US US EXTREM  UP VENOUS*R*
1 series · 13 of 24 positions shown · non-contrast
Comparison: None.

CLINICAL DATA: Right upper extremity pain.



[Series 1: us extrem up venous*right* · 13 of 28 slices shown]
[im 1/28]
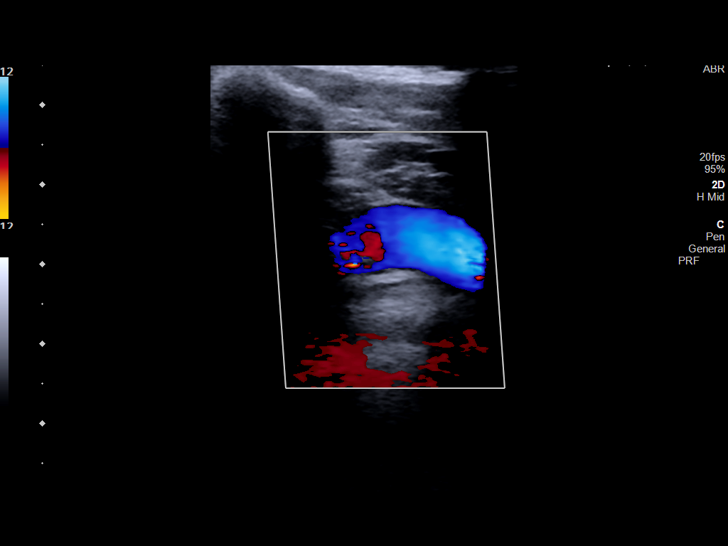
[im 3/28]
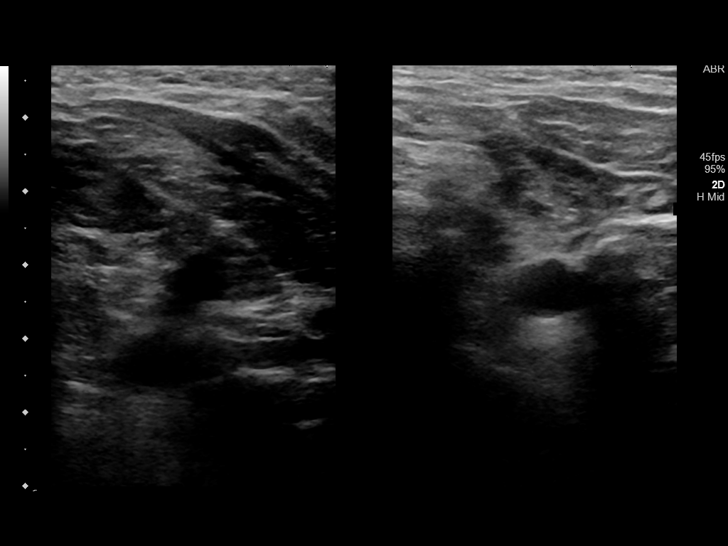
[im 5/28]
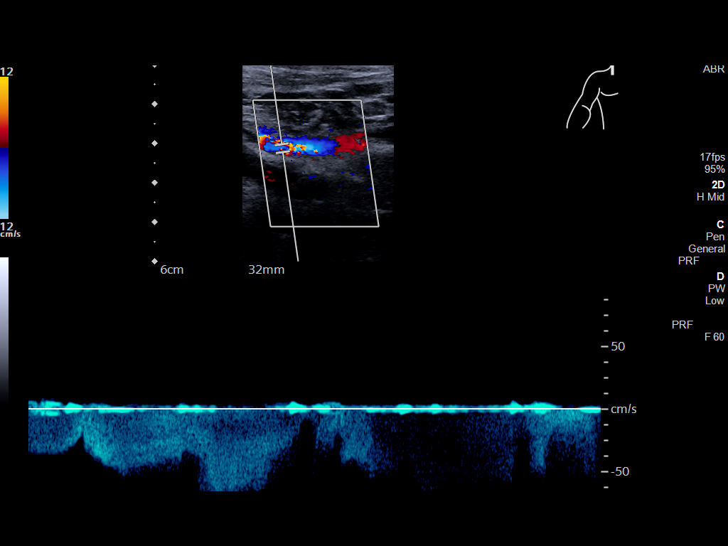
[im 8/28]
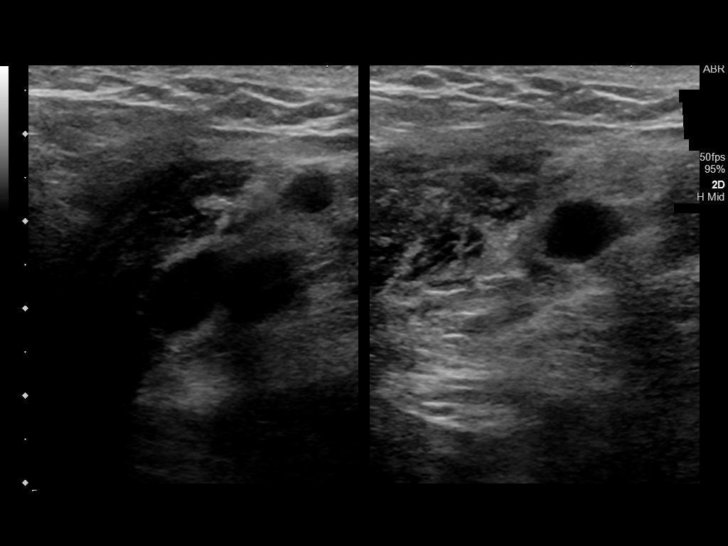
[im 10/28]
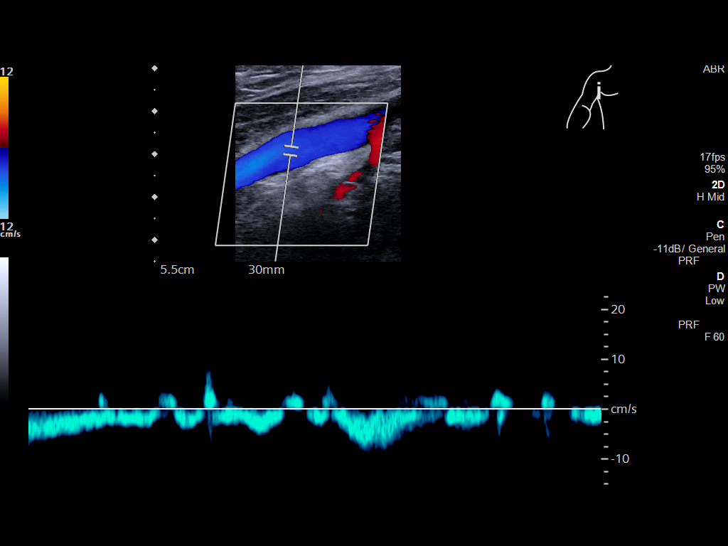
[im 12/28]
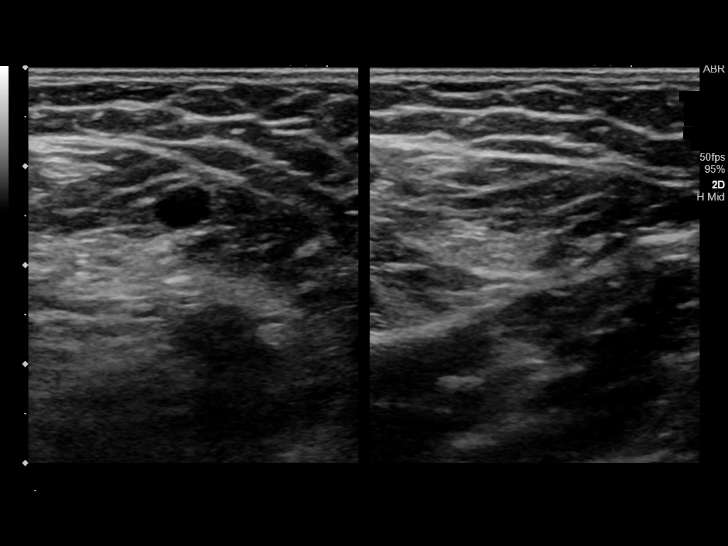
[im 15/28]
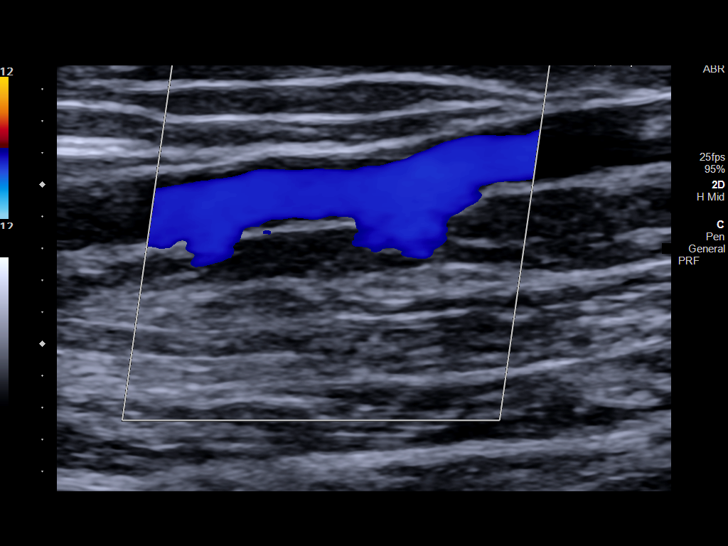
[im 16/28]
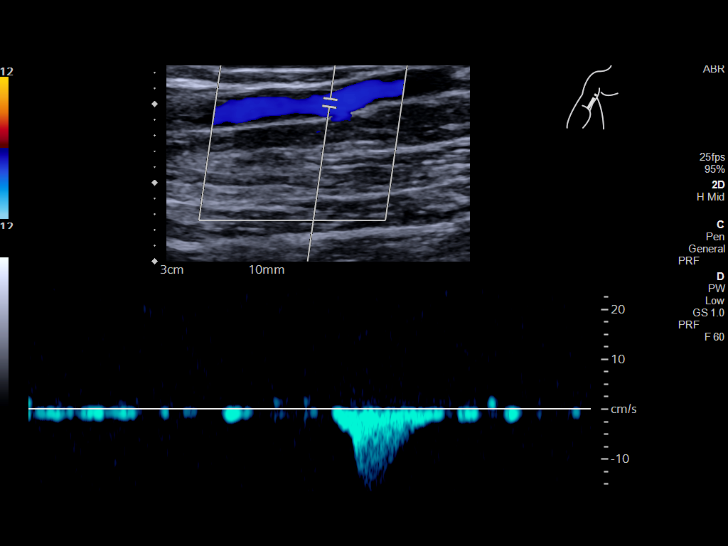
[im 18/28]
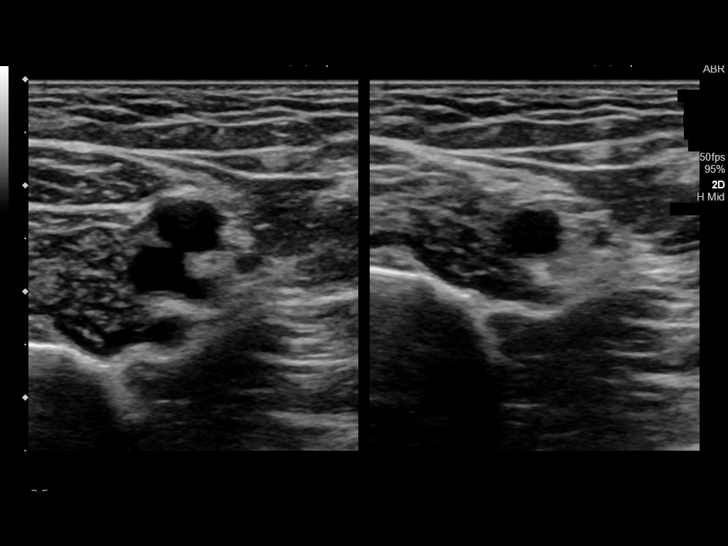
[im 20/28]
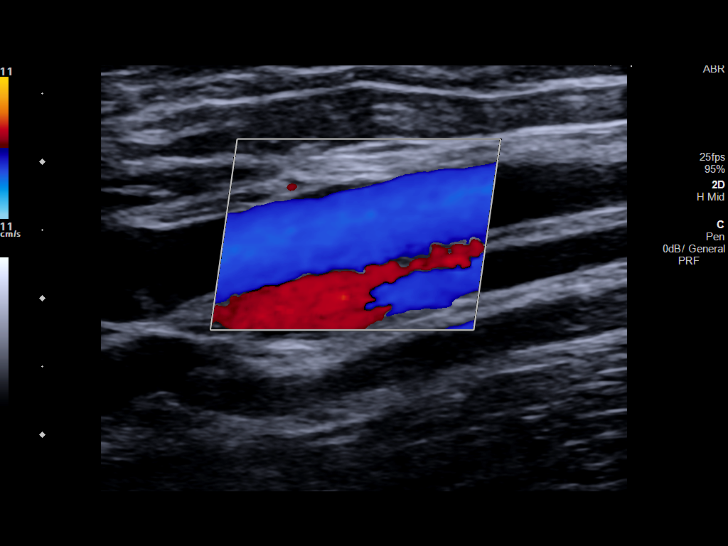
[im 23/28]
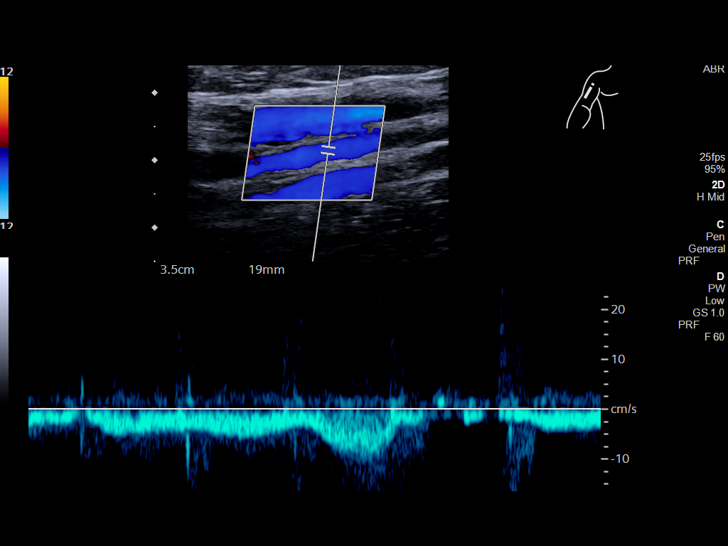
[im 25/28]
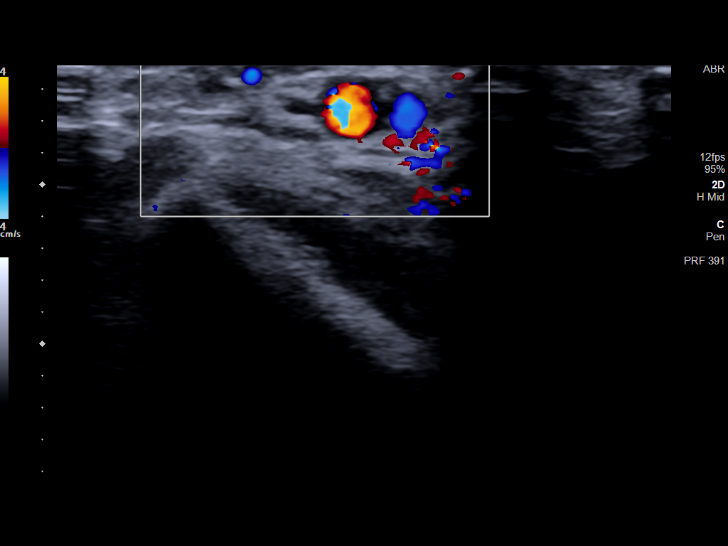
[im 28/28]
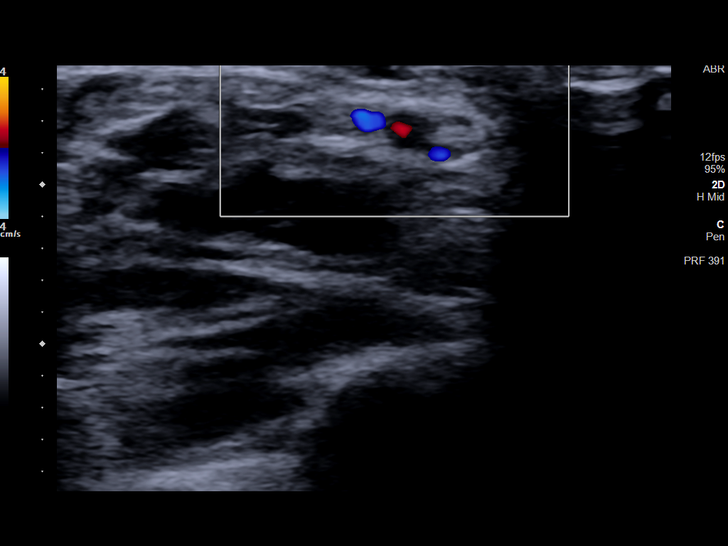

[13 of 24 positions shown; findings below may reference images not displayed]

FINDINGS: Contralateral Subclavian Vein: Color Doppler flow and normal
phasicity. No evidence for thrombus.

Internal Jugular Vein: No evidence of thrombus. Normal
compressibility with color Doppler flow and normal phasicity.

Subclavian Vein: No evidence of thrombus. Color Doppler flow and
normal phasicity.

Axillary Vein: No evidence of thrombus. Normal compressibility with
color Doppler flow and augmentation.

Cephalic Vein: Not confidently identified.

Basilic Vein: No evidence of thrombus. Normal compressibility with
color Doppler flow and augmentation.

Brachial Veins: No evidence of thrombus. Normal compressibility,
respiratory phasicity and response to augmentation.

Radial Veins: No evidence of thrombus. Normal compressibility and
color Doppler flow.

Ulnar Veins: No evidence of thrombus. Normal compressibility and
color Doppler flow.

Other Findings:  None visualized.
IMPRESSION: No evidence of DVT within the right upper extremity.

## 2020-12-09 ENCOUNTER — Other Ambulatory Visit: Payer: Self-pay

## 2020-12-09 ENCOUNTER — Ambulatory Visit (INDEPENDENT_AMBULATORY_CARE_PROVIDER_SITE_OTHER): Payer: Medicare Other | Admitting: Vascular Surgery

## 2020-12-09 ENCOUNTER — Ambulatory Visit (INDEPENDENT_AMBULATORY_CARE_PROVIDER_SITE_OTHER): Payer: Medicare Other

## 2020-12-09 DIAGNOSIS — I739 Peripheral vascular disease, unspecified: Secondary | ICD-10-CM | POA: Diagnosis not present

## 2020-12-20 ENCOUNTER — Encounter (INDEPENDENT_AMBULATORY_CARE_PROVIDER_SITE_OTHER): Payer: Self-pay | Admitting: *Deleted

## 2021-12-14 ENCOUNTER — Other Ambulatory Visit (INDEPENDENT_AMBULATORY_CARE_PROVIDER_SITE_OTHER): Payer: Self-pay | Admitting: Vascular Surgery

## 2021-12-14 DIAGNOSIS — I739 Peripheral vascular disease, unspecified: Secondary | ICD-10-CM

## 2021-12-15 ENCOUNTER — Encounter (INDEPENDENT_AMBULATORY_CARE_PROVIDER_SITE_OTHER): Payer: Self-pay

## 2021-12-15 ENCOUNTER — Ambulatory Visit (INDEPENDENT_AMBULATORY_CARE_PROVIDER_SITE_OTHER): Payer: Medicare Other | Admitting: Vascular Surgery

## 2021-12-15 ENCOUNTER — Ambulatory Visit (INDEPENDENT_AMBULATORY_CARE_PROVIDER_SITE_OTHER): Payer: Medicare Other

## 2021-12-15 DIAGNOSIS — I739 Peripheral vascular disease, unspecified: Secondary | ICD-10-CM | POA: Diagnosis not present

## 2022-12-17 ENCOUNTER — Other Ambulatory Visit (INDEPENDENT_AMBULATORY_CARE_PROVIDER_SITE_OTHER): Payer: Self-pay | Admitting: Vascular Surgery

## 2022-12-17 DIAGNOSIS — I739 Peripheral vascular disease, unspecified: Secondary | ICD-10-CM

## 2022-12-18 ENCOUNTER — Ambulatory Visit (INDEPENDENT_AMBULATORY_CARE_PROVIDER_SITE_OTHER): Payer: Medicare Other

## 2022-12-18 ENCOUNTER — Ambulatory Visit (INDEPENDENT_AMBULATORY_CARE_PROVIDER_SITE_OTHER): Payer: Medicare Other | Admitting: Vascular Surgery

## 2022-12-18 ENCOUNTER — Encounter (INDEPENDENT_AMBULATORY_CARE_PROVIDER_SITE_OTHER): Payer: Self-pay | Admitting: Vascular Surgery

## 2022-12-18 VITALS — BP 129/76 | HR 52 | Resp 18 | Ht 66.0 in | Wt 207.0 lb

## 2022-12-18 DIAGNOSIS — E118 Type 2 diabetes mellitus with unspecified complications: Secondary | ICD-10-CM | POA: Diagnosis not present

## 2022-12-18 DIAGNOSIS — I739 Peripheral vascular disease, unspecified: Secondary | ICD-10-CM | POA: Diagnosis not present

## 2022-12-18 DIAGNOSIS — I10 Essential (primary) hypertension: Secondary | ICD-10-CM

## 2022-12-18 DIAGNOSIS — I890 Lymphedema, not elsewhere classified: Secondary | ICD-10-CM | POA: Diagnosis not present

## 2022-12-18 NOTE — Assessment & Plan Note (Signed)
blood pressure control important in reducing the progression of atherosclerotic disease. On appropriate oral medications.  

## 2022-12-18 NOTE — Assessment & Plan Note (Signed)
Digit pressure on the left is slightly reduced and there may be some small vessel disease, but certainly perfusion is currently well-maintained.  Plan follow-up in 1 year.

## 2022-12-18 NOTE — Progress Notes (Signed)
MRN : [id number]  Jim Guerra. is a 77 y.o. (29-Apr-1946) male who presents with chief complaint of  Chief Complaint  Patient presents with   Follow-up    27yr. abi.  Marland Kitchen  History of Present Illness: Patient returns today in follow up of his leg swelling and pain.  He has been using petroleum jelly once or twice a day and this seems to ease the pain in his lower legs.  He cannot get compression socks on or off.  He has been wearing medical socks and trying to elevate his legs is much as possible.  He does still have persistent swelling which is not necessarily surprising, but it does not appear to have gotten much worse.  He denies any open ulceration or weeping of the tissue.  Skin has been intact.  No signs of fever or chills.  No chest pain or shortness of breath.  ABIs were done today showing a right ABI of 1.15 and a left ABI of 1.11.  Multiphasic waveforms are present and no significant flow limitation is present currently.  Current Outpatient Medications  Medication Sig Dispense Refill   ALPRAZolam (XANAX) 0.25 MG tablet Take 0.25 mg by mouth 2 (two) times daily.     aspirin EC 81 MG tablet Take 81 mg by mouth daily.      finasteride (PROSCAR) 5 MG tablet Take 5 mg by mouth daily.     glipiZIDE (GLUCOTROL XL) 5 MG 24 hr tablet Take 5 mg by mouth daily.     valsartan-hydrochlorothiazide (DIOVAN-HCT) 320-25 MG tablet Take 1 tablet by mouth daily.     No current facility-administered medications for this visit.    Past Medical History:  Diagnosis Date   Anxiety    Arthritis    Coronary artery disease    Diabetes mellitus without complication (HCC)    Dysrhythmia    Elevated lipids    Environmental allergies    HOH (hard of hearing)    Hypertension    Lymphedema of both lower extremities     Past Surgical History:  Procedure Laterality Date   CATARACT EXTRACTION W/PHACO Right 08/04/2015   Procedure: CATARACT EXTRACTION PHACO AND INTRAOCULAR LENS PLACEMENT (IOC);   Surgeon: Galen Manila, MD;  Location: [facility];  Service: Ophthalmology;  Laterality: Right;  [place] 00:50 AP% 21.4 CDE 10.79 fluid pack lot # [id number]   CATARACT EXTRACTION W/PHACO Left 11/03/2019   Procedure: CATARACT EXTRACTION PHACO AND INTRAOCULAR LENS PLACEMENT (IOC) LEFT DIABETIC;  Surgeon: Galen Manila, MD;  Location: [facility];  Service: Ophthalmology;  Laterality: Left;  6.25 0:37.1   COLONOSCOPY     FOOT SURGERY     HERNIA REPAIR     inguinal   HOLEP-LASER ENUCLEATION OF THE PROSTATE WITH MORCELLATION N/A 03/30/2019   Procedure: HOLEP-LASER ENUCLEATION OF THE PROSTATE WITH MORCELLATION;  Surgeon: Vanna Scotland, MD;  Location: [facility];  Service: Urology;  Laterality: N/A;   SCALP LACERATION REPAIR  1979     Social History   Tobacco Use   Smoking status: Former    Current packs/day: 0.00    Types: Cigarettes    Quit date: 1970    Years since quitting: 54.6   Smokeless tobacco: Former    Quit date: 1970  Substance Use Topics   Alcohol use: Not Currently    Comment: quit 1977   Drug use: No      Family History  Problem Relation Age of Onset   CAD Mother  COPD Mother      No Known Allergies   REVIEW OF SYSTEMS (Negative unless checked)  Constitutional: [] Weight loss  [] Fever  [] Chills Cardiac: [] Chest pain   [] Chest pressure   [] Palpitations   [] Shortness of breath when laying flat   [] Shortness of breath at rest   [] Shortness of breath with exertion. Vascular:  [x] Pain in legs with walking   [x] Pain in legs at rest   [] Pain in legs when laying flat   [] Claudication   [] Pain in feet when walking  [] Pain in feet at rest  [] Pain in feet when laying flat   [] History of DVT   [] Phlebitis   [x] Swelling in legs   [] Varicose veins   [] Non-healing ulcers Pulmonary:   [] Uses home oxygen   [] Productive cough   [] Hemoptysis   [] Wheeze  [] COPD   [] Asthma Neurologic:  [] Dizziness  [] Blackouts   [] Seizures   [] History of stroke   [] History of TIA   [] Aphasia   [] Temporary blindness   [] Dysphagia   [] Weakness or numbness in arms   [] Weakness or numbness in legs Musculoskeletal:  [x] Arthritis   [] Joint swelling   [x] Joint pain   [] Low back pain Hematologic:  [] Easy bruising  [] Easy bleeding   [] Hypercoagulable state   [] Anemic   Gastrointestinal:  [] Blood in stool   [] Vomiting blood  [] Gastroesophageal reflux/heartburn   [] Abdominal pain Genitourinary:  [] Chronic kidney disease   [] Difficult urination  [] Frequent urination  [] Burning with urination   [] Hematuria Skin:  [] Rashes   [] Ulcers   [] Wounds Psychological:  [] History of anxiety   []  History of major depression.  Physical Examination  BP 129/76 (BP Location: Left Arm)   Pulse (!) 52   Resp 18   Ht 5\' 6"  (1.676 m)   Wt 207 lb (93.9 kg)   BMI 33.41 kg/m  Gen:  WD/WN, NAD.  Appears younger than stated age Head: [place]/AT, No temporalis wasting.  Poor dentition Ear/Nose/Throat: Hearing grossly intact, nares w/o erythema or drainage Eyes: Conjunctiva clear. Sclera non-icteric Neck: Supple.  Trachea midline Pulmonary:  Good air movement, no use of accessory muscles.  Cardiac: RRR, no JVD Vascular:  Vessel Right Left  Radial Palpable Palpable                          PT 1+ palpable 1+ palpable  DP 1+ palpable 1+ palpable   Gastrointestinal: soft, non-tender/non-distended. No guarding/reflex.  Musculoskeletal: M/S 5/5 throughout.  No deformity or atrophy.  Moderate stasis dermatitis changes present bilaterally.  1+ bilateral lower extremity edema. Neurologic: Sensation grossly intact in extremities.  Symmetrical.  Speech is fluent.  Psychiatric: Judgment intact, Mood & affect appropriate for pt's clinical situation. Dermatologic: No rashes or ulcers noted.  No cellulitis or open wounds.      Labs No results found for this or any previous visit (from the past 2160 hour(s)).  Radiology No results found.  Assessment/Plan  PAD (peripheral artery disease) (HCC) Digit  pressure on the left is slightly reduced and there may be some small vessel disease, but certainly perfusion is currently well-maintained.  Plan follow-up in 1 year.  Essential hypertension, benign blood pressure control important in reducing the progression of atherosclerotic disease. On appropriate oral medications.   Type II diabetes mellitus with manifestations (HCC) blood glucose control important in reducing the progression of atherosclerotic disease. Also, involved in wound healing. On appropriate medications.   Lymphedema Symptom control is fair.  Has not been able to tolerate  compression socks.  Does elevate stays active.  No major changes.  Follow-up in 1 year.    Festus Barren, MD  12/18/2022 11:27 AM    This note was created with Dragon medical transcription system.  Any errors from dictation are purely unintentional

## 2022-12-18 NOTE — Assessment & Plan Note (Signed)
blood glucose control important in reducing the progression of atherosclerotic disease. Also, involved in wound healing. On appropriate medications.  

## 2022-12-18 NOTE — Assessment & Plan Note (Signed)
Symptom control is fair.  Has not been able to tolerate compression socks.  Does elevate stays active.  No major changes.  Follow-up in 1 year.

## 2023-09-24 ENCOUNTER — Encounter (INDEPENDENT_AMBULATORY_CARE_PROVIDER_SITE_OTHER): Payer: Self-pay

## 2023-10-23 ENCOUNTER — Telehealth (INDEPENDENT_AMBULATORY_CARE_PROVIDER_SITE_OTHER): Payer: Self-pay | Admitting: Nurse Practitioner

## 2023-10-23 NOTE — Telephone Encounter (Signed)
 I received a referral from [facility] which made it seem like patient was ok with keeping his 8.15.25 appt although he has 3 open wounds. I called to offer sooner appt time and sister in law states that they are out of town and will call back when they get back IF they feel that he needs to be soon earlier.

## 2023-12-19 ENCOUNTER — Other Ambulatory Visit (INDEPENDENT_AMBULATORY_CARE_PROVIDER_SITE_OTHER): Payer: Self-pay | Admitting: Vascular Surgery

## 2023-12-19 DIAGNOSIS — I739 Peripheral vascular disease, unspecified: Secondary | ICD-10-CM

## 2023-12-20 ENCOUNTER — Encounter (INDEPENDENT_AMBULATORY_CARE_PROVIDER_SITE_OTHER): Payer: Self-pay | Admitting: Nurse Practitioner

## 2023-12-20 ENCOUNTER — Ambulatory Visit (INDEPENDENT_AMBULATORY_CARE_PROVIDER_SITE_OTHER): Payer: Medicare Other

## 2023-12-20 ENCOUNTER — Ambulatory Visit (INDEPENDENT_AMBULATORY_CARE_PROVIDER_SITE_OTHER): Payer: Medicare Other | Admitting: Nurse Practitioner

## 2023-12-20 VITALS — BP 115/72 | HR 66 | Ht 66.0 in | Wt 193.6 lb

## 2023-12-20 DIAGNOSIS — I10 Essential (primary) hypertension: Secondary | ICD-10-CM | POA: Diagnosis not present

## 2023-12-20 DIAGNOSIS — I83003 Varicose veins of unspecified lower extremity with ulcer of ankle: Secondary | ICD-10-CM | POA: Diagnosis not present

## 2023-12-20 DIAGNOSIS — L97302 Non-pressure chronic ulcer of unspecified ankle with fat layer exposed: Secondary | ICD-10-CM

## 2023-12-20 DIAGNOSIS — E118 Type 2 diabetes mellitus with unspecified complications: Secondary | ICD-10-CM | POA: Diagnosis not present

## 2023-12-20 DIAGNOSIS — I739 Peripheral vascular disease, unspecified: Secondary | ICD-10-CM

## 2023-12-23 ENCOUNTER — Encounter (INDEPENDENT_AMBULATORY_CARE_PROVIDER_SITE_OTHER): Payer: Self-pay | Admitting: Nurse Practitioner

## 2023-12-23 LAB — VAS US ABI WITH/WO TBI
Left ABI: 1.2
Right ABI: 1.17

## 2023-12-23 NOTE — Progress Notes (Signed)
 Subjective:    Patient ID: Dannielle FORBES Fabian Mickey., male    DOB: Oct 27, 1945, 78 y.o.   MRN: [id number] Chief Complaint  Patient presents with   Follow-up     1 year follow up with ABI see [email or web address]     The patient returns to the office for followup regarding atherosclerotic changes of the lower extremities and review of the noninvasive studies.   There have been no interval changes in lower extremity symptoms. No interval shortening of the patient's claudication distance or development of rest pain symptoms.  He also developed 3 small wounds on his his right lateral ankle area.  These have been slowly healing over the last several months.  He notes that he has been utilizing his compression very consistently and rubbing Vaseline over the area.  Initially it appears that there were 3 open areas.  2 of the areas appear to be essentially healed with 1 still actively healing.  He does have some notable varicosities in his lower extremity as well.  There have been no significant changes to the patient's overall health care.  The patient denies amaurosis fugax or recent TIA symptoms. There are no documented recent neurological changes noted. There is no history of DVT, PE or superficial thrombophlebitis. The patient denies recent episodes of angina or shortness of breath.   ABI Rt=1.17 and Lt=1.20  (previous ABI's Rt=1.15 and Lt=1.11) Duplex ultrasound of the bilateral tibial vessels reveals multiphasic waveforms with good toe waveforms.    Review of Systems  Cardiovascular:  Positive for leg swelling.  Skin:  Positive for wound.  All other systems reviewed and are negative.      Objective:   Physical Exam Vitals reviewed.  HENT:     Head: Normocephalic.  Cardiovascular:     Rate and Rhythm: Normal rate.  Pulmonary:     Effort: Pulmonary effort is normal.  Musculoskeletal:     Right lower leg: Edema present.  Skin:    General: Skin is warm and dry.  Neurological:     Mental Status:  He is alert and oriented to person, place, and time.  Psychiatric:        Mood and Affect: Mood normal.        Behavior: Behavior normal.        Thought Content: Thought content normal.        Judgment: Judgment normal.     BP 115/72   Pulse 66   Ht 5' 6 (1.676 m)   Wt 193 lb 9.6 oz (87.8 kg)   BMI 31.25 kg/m   Past Medical History:  Diagnosis Date   Anxiety    Arthritis    Coronary artery disease    Diabetes mellitus without complication (HCC)    Dysrhythmia    Elevated lipids    Environmental allergies    HOH (hard of hearing)    Hypertension    Lymphedema of both lower extremities     Social History   Socioeconomic History   Marital status: Married    Spouse name: Not on file   Number of children: Not on file   Years of education: Not on file   Highest education level: Not on file  Occupational History   Not on file  Tobacco Use   Smoking status: Former    Current packs/day: 0.00    Types: Cigarettes    Quit date: 1970    Years since quitting: 55.6   Smokeless tobacco: Former    Quit date:  1970  Vaping Use   Vaping status: Not on file  Substance and Sexual Activity   Alcohol use: Not Currently    Comment: quit 1977   Drug use: No   Sexual activity: Not Currently  Other Topics Concern   Not on file  Social History Narrative   Not on file   Social Drivers of Health   Financial Resource Strain: Not on file  Food Insecurity: Not on file  Transportation Needs: Not on file  Physical Activity: Not on file  Stress: Not on file  Social Connections: Not on file  Intimate Partner Violence: Not on file    Past Surgical History:  Procedure Laterality Date   CATARACT EXTRACTION W/PHACO Right 08/04/2015   Procedure: CATARACT EXTRACTION PHACO AND INTRAOCULAR LENS PLACEMENT (IOC);  Surgeon: Elsie Carmine, MD;  Location: [facility];  Service: Ophthalmology;  Laterality: Right;  US  00:50 AP% 21.4 CDE 10.79 fluid pack lot # [id number]   CATARACT EXTRACTION  W/PHACO Left 11/03/2019   Procedure: CATARACT EXTRACTION PHACO AND INTRAOCULAR LENS PLACEMENT (IOC) LEFT DIABETIC;  Surgeon: Carmine Elsie, MD;  Location: [facility];  Service: Ophthalmology;  Laterality: Left;  6.25 0:37.1   COLONOSCOPY     FOOT SURGERY     HERNIA REPAIR     inguinal   HOLEP-LASER ENUCLEATION OF THE PROSTATE WITH MORCELLATION N/A 03/30/2019   Procedure: HOLEP-LASER ENUCLEATION OF THE PROSTATE WITH MORCELLATION;  Surgeon: Penne Knee, MD;  Location: [facility];  Service: Urology;  Laterality: N/A;   SCALP LACERATION REPAIR  1979    Family History  Problem Relation Age of Onset   CAD Mother    COPD Mother     No Known Allergies     Latest Ref Rng & Units 02/18/2019    4:22 AM 02/17/2019    5:14 AM 02/16/2019   11:29 AM  CBC  WBC 4.0 - 10.5 K/uL 10.2  13.2  20.5   Hemoglobin 13.0 - 17.0 g/dL 87.3  87.7  85.7   Hematocrit 39.0 - 52.0 % 37.2  35.4  41.7   Platelets 150 - 400 K/uL 286  270  365       CMP     Component Value Date/Time   NA 139 02/18/2019 0422   K 3.7 02/18/2019 0422   CL 105 02/18/2019 0422   CO2 21 (L) 02/18/2019 0422   GLUCOSE 136 (H) 02/18/2019 0422   BUN 81 (H) 02/18/2019 0422   CREATININE 1.43 (H) 02/18/2019 0422   CALCIUM  7.3 (L) 02/18/2019 0422   PROT 7.4 02/16/2019 1129   ALBUMIN 3.2 (L) 02/16/2019 1129   AST 8 (L) 02/16/2019 1129   ALT 9 02/16/2019 1129   ALKPHOS 54 02/16/2019 1129   BILITOT 1.0 02/16/2019 1129   GFRNONAA 48 (L) 02/18/2019 0422     VAS US  ABI WITH/WO TBI Result Date: 12/23/2023  LOWER EXTREMITY DOPPLER STUDY Patient Name:  BREYDEN JEUDY  Date of Exam:   12/20/2023 Medical Rec #: [id number]         Accession #:    [id number] Date of Birth: 1945-10-21         Patient Gender: M Patient Age:   68 years Exam Location:  [facility] Procedure:      VAS US  ABI WITH/WO TBI Referring Phys: --------------------------------------------------------------------------------  Indications:  Peripheral artery disease. High Risk Factors: Hyperlipidemia.  Comparison Study: 12/2022 Performing Technologist: Jerel Croak RVT  Examination Guidelines: A complete evaluation includes at minimum, Doppler waveform signals  and systolic blood pressure reading at the level of bilateral brachial, anterior tibial, and posterior tibial arteries, when vessel segments are accessible. Bilateral testing is considered an integral part of a complete examination. Photoelectric Plethysmograph (PPG) waveforms and toe systolic pressure readings are included as required and additional duplex testing as needed. Limited examinations for reoccurring indications may be performed as noted.  ABI Findings: +---------+------------------+-----+---------+--------+ Right    Rt Pressure (mmHg)IndexWaveform Comment  +---------+------------------+-----+---------+--------+ Brachial 121                                      +---------+------------------+-----+---------+--------+ PTA      142               1.17 triphasic         +---------+------------------+-----+---------+--------+ DP       126               1.04 biphasic          +---------+------------------+-----+---------+--------+ Great Toe78                0.64 Abnormal          +---------+------------------+-----+---------+--------+ +---------+------------------+-----+---------+-------+ Left     Lt Pressure (mmHg)IndexWaveform Comment +---------+------------------+-----+---------+-------+ Brachial 116                                     +---------+------------------+-----+---------+-------+ PTA      142               1.17 biphasic         +---------+------------------+-----+---------+-------+ DP       145               1.20 triphasic        +---------+------------------+-----+---------+-------+ Great Toe104               0.86 Abnormal         +---------+------------------+-----+---------+-------+  +-------+-----------+-----------+------------+------------+ ABI/TBIToday's ABIToday's TBIPrevious ABIPrevious TBI +-------+-----------+-----------+------------+------------+ Right  1.17       .64        1.15        .78          +-------+-----------+-----------+------------+------------+ Left   1.20       .86        1.11        .66          +-------+-----------+-----------+------------+------------+  Bilateral ABIs appear essentially unchanged compared to prior study on 12/2022.  Summary: Right: Resting right ankle-brachial index is within normal range. The right toe-brachial index is abnormal. Left: Resting left ankle-brachial index is within normal range. The left toe-brachial index is normal. *See table(s) above for measurements and observations.  Electronically signed by Selinda Gu MD on 12/23/2023 at 7:47:35 AM.    Final        Assessment & Plan:   1. PAD (peripheral artery disease) (HCC) (Primary)  Recommend:  The patient has evidence of atherosclerosis of the lower extremities with claudication.  The patient does not voice lifestyle limiting changes at this point in time.  Noninvasive studies do not suggest clinically significant change.  No invasive studies, angiography or surgery at this time The patient should continue walking and begin a more formal exercise program.  The patient should continue antiplatelet therapy and aggressive treatment of the lipid abnormalities  No changes in the patient's medications  at this time  Continued surveillance is indicated as atherosclerosis is likely to progress with time.    The patient will continue follow up with noninvasive studies as ordered in 12 months  2. 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  He has been healing his wounds fairly well compared to where they were previously.  2  of his wounds seem to be healed and the third appears to be more shallow.  We discussed the use of possible Unna boots but at this time he wishes to continue with his current treatment, which seems to be working well.  He is advised to continue with use of medical grade compression as this is certainly helping to heal his wounds.  Will have her return in 3 months with a reflux study to evaluate for venous reflux.  He is advised that if these wounds enlarge or become worse to contact us  to be seen sooner.  3. Essential hypertension, benign Continue antihypertensive medications as already ordered, these medications have been reviewed and there are no changes at this time.  4. Type II diabetes mellitus with manifestations (HCC) Continue hypoglycemic medications as already ordered, these medications have been reviewed and there are no changes at this time.  Hgb A1C to be monitored as already arranged by primary service   Current Outpatient Medications on File Prior to Visit  Medication Sig Dispense Refill   ALPRAZolam (XANAX) 0.25 MG tablet Take 0.25 mg by mouth 2 (two) times daily.     aspirin  EC 81 MG tablet Take 81 mg by mouth daily.      finasteride  (PROSCAR ) 5 MG tablet Take 5 mg by mouth daily.     glipiZIDE (GLUCOTROL XL) 5 MG 24 hr tablet Take 5 mg by mouth daily.     valsartan-hydrochlorothiazide (DIOVAN-HCT) 320-25 MG tablet Take 1 tablet by mouth daily.     No current facility-administered medications on file prior to visit.    There are no Patient Instructions on file for this visit. No follow-ups on file.   Garlene Apperson E Jesly Hartmann, NP

## 2024-03-18 ENCOUNTER — Other Ambulatory Visit (INDEPENDENT_AMBULATORY_CARE_PROVIDER_SITE_OTHER): Payer: Self-pay | Admitting: Nurse Practitioner

## 2024-03-18 DIAGNOSIS — I83003 Varicose veins of unspecified lower extremity with ulcer of ankle: Secondary | ICD-10-CM

## 2024-03-20 ENCOUNTER — Encounter (INDEPENDENT_AMBULATORY_CARE_PROVIDER_SITE_OTHER): Payer: Self-pay | Admitting: Vascular Surgery

## 2024-03-20 ENCOUNTER — Ambulatory Visit (INDEPENDENT_AMBULATORY_CARE_PROVIDER_SITE_OTHER): Admitting: Vascular Surgery

## 2024-03-20 ENCOUNTER — Other Ambulatory Visit (INDEPENDENT_AMBULATORY_CARE_PROVIDER_SITE_OTHER)

## 2024-03-20 VITALS — BP 105/69 | HR 84 | Resp 18 | Ht 66.0 in | Wt 193.0 lb

## 2024-03-20 DIAGNOSIS — E118 Type 2 diabetes mellitus with unspecified complications: Secondary | ICD-10-CM | POA: Diagnosis not present

## 2024-03-20 DIAGNOSIS — I83003 Varicose veins of unspecified lower extremity with ulcer of ankle: Secondary | ICD-10-CM | POA: Diagnosis not present

## 2024-03-20 DIAGNOSIS — I8001 Phlebitis and thrombophlebitis of superficial vessels of right lower extremity: Secondary | ICD-10-CM | POA: Diagnosis not present

## 2024-03-20 DIAGNOSIS — I10 Essential (primary) hypertension: Secondary | ICD-10-CM

## 2024-03-20 DIAGNOSIS — I739 Peripheral vascular disease, unspecified: Secondary | ICD-10-CM | POA: Diagnosis not present

## 2024-03-20 DIAGNOSIS — I809 Phlebitis and thrombophlebitis of unspecified site: Secondary | ICD-10-CM | POA: Insufficient documentation

## 2024-03-20 DIAGNOSIS — L97302 Non-pressure chronic ulcer of unspecified ankle with fat layer exposed: Secondary | ICD-10-CM | POA: Diagnosis not present

## 2024-03-20 NOTE — Progress Notes (Signed)
 MRN : [id number]  Jim Guerra. is a 78 y.o. (02-Oct-1945) male who presents with chief complaint of  Chief Complaint  Patient presents with   Follow-up    3 months + Venous Reflux  .  History of Present Illness: Patient returns today in follow up with his venous study today.  He is still having a lot of aching in his legs.  His right leg remains more swollen than the left.  He has been diligently wearing his compression socks and elevating his legs which has helped the swelling somewhat.  No chest pain or shortness of breath.  No fevers or chills.  Reflux study was performed today.  There was evidence of superficial thrombophlebitis in the right great saphenous vein but not up to the saphenofemoral junction.  The left great saphenous vein demonstrates reflux throughout.  Current Outpatient Medications  Medication Sig Dispense Refill   ALPRAZolam (XANAX) 0.25 MG tablet Take 0.25 mg by mouth 2 (two) times daily.     aspirin  EC 81 MG tablet Take 81 mg by mouth daily.      finasteride  (PROSCAR ) 5 MG tablet Take 5 mg by mouth daily.     glipiZIDE (GLUCOTROL XL) 5 MG 24 hr tablet Take 5 mg by mouth daily.     valsartan-hydrochlorothiazide (DIOVAN-HCT) 320-25 MG tablet Take 1 tablet by mouth daily.     No current facility-administered medications for this visit.    Past Medical History:  Diagnosis Date   Anxiety    Arthritis    Coronary artery disease    Diabetes mellitus without complication (HCC)    Dysrhythmia    Elevated lipids    Environmental allergies    HOH (hard of hearing)    Hypertension    Lymphedema of both lower extremities     Past Surgical History:  Procedure Laterality Date   CATARACT EXTRACTION W/PHACO Right 08/04/2015   Procedure: CATARACT EXTRACTION PHACO AND INTRAOCULAR LENS PLACEMENT (IOC);  Surgeon: Elsie Carmine, MD;  Location: [facility];  Service: Ophthalmology;  Laterality: Right;  US  00:50 AP% 21.4 CDE 10.79 fluid pack lot # [id number]    CATARACT EXTRACTION W/PHACO Left 11/03/2019   Procedure: CATARACT EXTRACTION PHACO AND INTRAOCULAR LENS PLACEMENT (IOC) LEFT DIABETIC;  Surgeon: Carmine Elsie, MD;  Location: [facility];  Service: Ophthalmology;  Laterality: Left;  6.25 0:37.1   COLONOSCOPY     FOOT SURGERY     HERNIA REPAIR     inguinal   HOLEP-LASER ENUCLEATION OF THE PROSTATE WITH MORCELLATION N/A 03/30/2019   Procedure: HOLEP-LASER ENUCLEATION OF THE PROSTATE WITH MORCELLATION;  Surgeon: Penne Knee, MD;  Location: [facility];  Service: Urology;  Laterality: N/A;   SCALP LACERATION REPAIR  1979     Social History   Tobacco Use   Smoking status: Former    Current packs/day: 0.00    Types: Cigarettes    Quit date: 1970    Years since quitting: 55.9   Smokeless tobacco: Former    Quit date: 1970  Substance Use Topics   Alcohol use: Not Currently    Comment: quit 1977   Drug use: No      Family History  Problem Relation Age of Onset   CAD Mother    COPD Mother     No Known Allergies   REVIEW OF SYSTEMS (Negative unless checked)   Constitutional: [] Weight loss  [] Fever  [] Chills Cardiac: [] Chest pain   [] Chest pressure   [] Palpitations   [] Shortness of  breath when laying flat   [] Shortness of breath at rest   [] Shortness of breath with exertion. Vascular:  [x] Pain in legs with walking   [x] Pain in legs at rest   [] Pain in legs when laying flat   [] Claudication   [] Pain in feet when walking  [] Pain in feet at rest  [] Pain in feet when laying flat   [] History of DVT   [x] Phlebitis   [x] Swelling in legs   [] Varicose veins   [] Non-healing ulcers Pulmonary:   [] Uses home oxygen   [] Productive cough   [] Hemoptysis   [] Wheeze  [] COPD   [] Asthma Neurologic:  [] Dizziness  [] Blackouts   [] Seizures   [] History of stroke   [] History of TIA  [] Aphasia   [] Temporary blindness   [] Dysphagia   [] Weakness or numbness in arms   [] Weakness or numbness in legs Musculoskeletal:  [x] Arthritis   [] Joint swelling    [x] Joint pain   [] Low back pain Hematologic:  [] Easy bruising  [] Easy bleeding   [] Hypercoagulable state   [] Anemic   Gastrointestinal:  [] Blood in stool   [] Vomiting blood  [] Gastroesophageal reflux/heartburn   [] Abdominal pain Genitourinary:  [] Chronic kidney disease   [] Difficult urination  [] Frequent urination  [] Burning with urination   [] Hematuria Skin:  [] Rashes   [] Ulcers   [] Wounds Psychological:  [] History of anxiety   []  History of major depression.    Physical Examination  BP 105/69   Pulse 84   Resp 18   Ht 5' 6 (1.676 m)   Wt 193 lb (87.5 kg)   BMI 31.15 kg/m  Gen:  WD/WN, NAD.  Appears younger than stated age Head: [place]/AT, No temporalis wasting. Ear/Nose/Throat: Hearing grossly intact, nares w/o erythema or drainage Eyes: Conjunctiva clear. Sclera non-icteric Neck: Supple.  Trachea midline Pulmonary:  Good air movement, no use of accessory muscles.  Cardiac: RRR, no JVD Vascular:  Vessel Right Left  Radial Palpable Palpable               Musculoskeletal: M/S 5/5 throughout.  No deformity or atrophy.  1+ right lower extremity edema, trace left lower extremity edema.  Moderate stasis dermatitis changes are present bilaterally. Neurologic: Sensation grossly intact in extremities.  Symmetrical.  Speech is fluent.  Psychiatric: Judgment intact, Mood & affect appropriate for pt's clinical situation. Dermatologic: No rashes or ulcers noted.  No cellulitis or open wounds.      Labs No results found for this or any previous visit (from the past 2160 hours).  Radiology No results found.  Assessment/Plan  Superficial thrombophlebitis Reflux study was performed today.  There was evidence of superficial thrombophlebitis in the right great saphenous vein but not up to the saphenofemoral junction.  The left great saphenous vein demonstrates reflux throughout.  Is on aspirin  therapy.  There is not extension to the saphenofemoral junction so we will not increase to  full anticoagulation as his symptoms are quite mild.  To recheck him in 3 months with a duplex study.  He does have reflux on the left which could be contributing to his swelling, but I would like to get through this acute episode of superficial thrombophlebitis before we consider any venous intervention.   PAD (peripheral artery disease) (HCC) Recently checked and stable.   Essential hypertension, benign blood pressure control important in reducing the progression of atherosclerotic disease. On appropriate oral medications.     Type II diabetes mellitus with manifestations (HCC) blood glucose control important in reducing the progression of atherosclerotic disease. Also, involved  in wound healing. On appropriate medications.  Selinda Gu, MD  03/20/2024 12:42 PM    This note was created with Dragon medical transcription system.  Any errors from dictation are purely unintentional

## 2024-03-20 NOTE — Assessment & Plan Note (Signed)
 Reflux study was performed today.  There was evidence of superficial thrombophlebitis in the right great saphenous vein but not up to the saphenofemoral junction.  The left great saphenous vein demonstrates reflux throughout.  Is on aspirin  therapy.  There is not extension to the saphenofemoral junction so we will not increase to full anticoagulation as his symptoms are quite mild.  To recheck him in 3 months with a duplex study.  He does have reflux on the left which could be contributing to his swelling, but I would like to get through this acute episode of superficial thrombophlebitis before we consider any venous intervention.

## 2024-06-19 ENCOUNTER — Encounter (INDEPENDENT_AMBULATORY_CARE_PROVIDER_SITE_OTHER)

## 2024-06-19 ENCOUNTER — Ambulatory Visit (INDEPENDENT_AMBULATORY_CARE_PROVIDER_SITE_OTHER): Admitting: Vascular Surgery
